# Patient Record
Sex: Female | Born: 1990 | Race: White | Hispanic: No | Marital: Single | Smoking: Never smoker
Health system: Southern US, Community
[De-identification: ages and names within clinical notes are randomized; demographics above are authoritative.]

## PROBLEM LIST (undated history)

## (undated) DIAGNOSIS — E079 Disorder of thyroid, unspecified: Secondary | ICD-10-CM

## (undated) DIAGNOSIS — B019 Varicella without complication: Secondary | ICD-10-CM

## (undated) HISTORY — DX: Varicella without complication: B01.9

## (undated) HISTORY — PX: WISDOM TOOTH EXTRACTION: SHX21

## (undated) HISTORY — DX: Disorder of thyroid, unspecified: E07.9

---

## 2010-06-13 ENCOUNTER — Encounter: Admission: RE | Admit: 2010-06-13 | Discharge: 2010-06-13 | Payer: Self-pay | Admitting: Orthopedic Surgery

## 2013-08-22 ENCOUNTER — Ambulatory Visit (HOSPITAL_COMMUNITY): Payer: Self-pay | Admitting: Psychiatry

## 2013-10-31 ENCOUNTER — Encounter: Payer: Self-pay | Admitting: Nurse Practitioner

## 2013-10-31 ENCOUNTER — Ambulatory Visit (INDEPENDENT_AMBULATORY_CARE_PROVIDER_SITE_OTHER): Payer: 59 | Admitting: Nurse Practitioner

## 2013-10-31 VITALS — BP 100/60 | HR 71 | Temp 97.9°F | Ht 66.17 in | Wt 147.0 lb

## 2013-10-31 DIAGNOSIS — Z0000 Encounter for general adult medical examination without abnormal findings: Secondary | ICD-10-CM

## 2013-10-31 DIAGNOSIS — G8929 Other chronic pain: Secondary | ICD-10-CM | POA: Insufficient documentation

## 2013-10-31 DIAGNOSIS — M549 Dorsalgia, unspecified: Secondary | ICD-10-CM

## 2013-10-31 DIAGNOSIS — Z111 Encounter for screening for respiratory tuberculosis: Secondary | ICD-10-CM

## 2013-10-31 MED ORDER — TUBERCULIN PPD 5 UNIT/0.1ML ID SOLN
5.0000 [IU] | Freq: Once | INTRADERMAL | Status: DC
  Administered 2013-10-31: 5 [IU] via INTRADERMAL

## 2013-10-31 NOTE — Patient Instructions (Addendum)
Our office will call to discuss lab results. Return in 2 days for TB test reading. Second test can be placed next Monday or anytime after. Next physical in 2-5 years. See guidelines for pap screening. Great to meet you! Best wishes with school adventures.  Preventive Care for Adults, Female A healthy lifestyle and preventive care can promote health and wellness. Preventive health guidelines for women include the following key practices.  A routine yearly physical is a good way to check with your caregiver about your health and preventive screening. It is a chance to share any concerns and updates on your health, and to receive a thorough exam.  Visit your dentist for a routine exam and preventive care every 6 months. Brush your teeth twice a day and floss once a day. Good oral hygiene prevents tooth decay and gum disease.  The frequency of eye exams is based on your age, health, family medical history, use of contact lenses, and other factors. Follow your caregiver's recommendations for frequency of eye exams.  Eat a healthy diet. Foods like vegetables, fruits, whole grains, low-fat dairy products, and lean protein foods contain the nutrients you need without too many calories. Decrease your intake of foods high in solid fats, added sugars, and salt. Eat the right amount of calories for you.Get information about a proper diet from your caregiver, if necessary.  Regular physical exercise is one of the most important things you can do for your health. Most adults should get at least 150 minutes of moderate-intensity exercise (any activity that increases your heart rate and causes you to sweat) each week. In addition, most adults need muscle-strengthening exercises on 2 or more days a week.  Maintain a healthy weight. The body mass index (BMI) is a screening tool to identify possible weight problems. It provides an estimate of body fat based on height and weight. Your caregiver can help determine your  BMI, and can help you achieve or maintain a healthy weight.For adults 20 years and older:  A BMI below 18.5 is considered underweight.  A BMI of 18.5 to 24.9 is normal.  A BMI of 25 to 29.9 is considered overweight.  A BMI of 30 and above is considered obese.  Maintain normal blood lipids and cholesterol levels by exercising and minimizing your intake of saturated fat. Eat a balanced diet with plenty of fruit and vegetables. Blood tests for lipids and cholesterol should begin at age 28 and be repeated every 5 years. If your lipid or cholesterol levels are high, you are over 50, or you are at high risk for heart disease, you may need your cholesterol levels checked more frequently.Ongoing high lipid and cholesterol levels should be treated with medicines if diet and exercise are not effective.  If you smoke, find out from your caregiver how to quit. If you do not use tobacco, do not start.  Lung cancer screening is recommended for adults aged 8 80 years who are at high risk for developing lung cancer because of a history of smoking. Yearly low-dose computed tomography (CT) is recommended for people who have at least a 30-pack-year history of smoking and are a current smoker or have quit within the past 15 years. A pack year of smoking is smoking an average of 1 pack of cigarettes a day for 1 year (for example: 1 pack a day for 30 years or 2 packs a day for 15 years). Yearly screening should continue until the smoker has stopped smoking for at least  15 years. Yearly screening should also be stopped for people who develop a health problem that would prevent them from having lung cancer treatment.  If you are pregnant, do not drink alcohol. If you are breastfeeding, be very cautious about drinking alcohol. If you are not pregnant and choose to drink alcohol, do not exceed 1 drink per day. One drink is considered to be 12 ounces (355 mL) of beer, 5 ounces (148 mL) of wine, or 1.5 ounces (44 mL) of  liquor.  Avoid use of street drugs. Do not share needles with anyone. Ask for help if you need support or instructions about stopping the use of drugs.  High blood pressure causes heart disease and increases the risk of stroke. Your blood pressure should be checked at least every 1 to 2 years. Ongoing high blood pressure should be treated with medicines if weight loss and exercise are not effective.  If you are 22 to 22 years old, ask your caregiver if you should take aspirin to prevent strokes.  Diabetes screening involves taking a blood sample to check your fasting blood sugar level. This should be done once every 3 years, after age 34, if you are within normal weight and without risk factors for diabetes. Testing should be considered at a younger age or be carried out more frequently if you are overweight and have at least 1 risk factor for diabetes.  Breast cancer screening is essential preventive care for women. You should practice "breast self-awareness." This means understanding the normal appearance and feel of your breasts and may include breast self-examination. Any changes detected, no matter how small, should be reported to a caregiver. Women in their 24s and 30s should have a clinical breast exam (CBE) by a caregiver as part of a regular health exam every 1 to 3 years. After age 58, women should have a CBE every year. Starting at age 32, women should consider having a mammography (breast X-ray test) every year. Women who have a family history of breast cancer should talk to their caregiver about genetic screening. Women at a high risk of breast cancer should talk to their caregivers about having magnetic resonance imaging (MRI) and a mammography every year.  Breast cancer gene (BRCA)-related cancer risk assessment is recommended for women who have family members with BRCA-related cancers. BRCA-related cancers include breast, ovarian, tubal, and peritoneal cancers. Having family members with  these cancers may be associated with an increased risk for harmful changes (mutations) in the breast cancer genes BRCA1 and BRCA2. Results of the assessment will determine the need for genetic counseling and BRCA1 and BRCA2 testing.  The Pap test is a screening test for cervical cancer. A Pap test can show cell changes on the cervix that might become cervical cancer if left untreated. A Pap test is a procedure in which cells are obtained and examined from the lower end of the uterus (cervix).  Women should have a Pap test starting at age 3.  Between ages 68 and 31, Pap tests should be repeated every 2 years.  Beginning at age 36, you should have a Pap test every 3 years as long as the past 3 Pap tests have been normal.  Some women have medical problems that increase the chance of getting cervical cancer. Talk to your caregiver about these problems. It is especially important to talk to your caregiver if a new problem develops soon after your last Pap test. In these cases, your caregiver may recommend more frequent screening  and Pap tests.  The above recommendations are the same for women who have or have not gotten the vaccine for human papillomavirus (HPV).  If you had a hysterectomy for a problem that was not cancer or a condition that could lead to cancer, then you no longer need Pap tests. Even if you no longer need a Pap test, a regular exam is a good idea to make sure no other problems are starting.  If you are between ages 13 and 19, and you have had normal Pap tests going back 10 years, you no longer need Pap tests. Even if you no longer need a Pap test, a regular exam is a good idea to make sure no other problems are starting.  If you have had past treatment for cervical cancer or a condition that could lead to cancer, you need Pap tests and screening for cancer for at least 20 years after your treatment.  If Pap tests have been discontinued, risk factors (such as a new sexual partner)  need to be reassessed to determine if screening should be resumed.  The HPV test is an additional test that may be used for cervical cancer screening. The HPV test looks for the virus that can cause the cell changes on the cervix. The cells collected during the Pap test can be tested for HPV. The HPV test could be used to screen women aged 40 years and older, and should be used in women of any age who have unclear Pap test results. After the age of 60, women should have HPV testing at the same frequency as a Pap test.  Colorectal cancer can be detected and often prevented. Most routine colorectal cancer screening begins at the age of 107 and continues through age 65. However, your caregiver may recommend screening at an earlier age if you have risk factors for colon cancer. On a yearly basis, your caregiver may provide home test kits to check for hidden blood in the stool. Use of a small camera at the end of a tube, to directly examine the colon (sigmoidoscopy or colonoscopy), can detect the earliest forms of colorectal cancer. Talk to your caregiver about this at age 79, when routine screening begins. Direct examination of the colon should be repeated every 5 to 10 years through age 16, unless early forms of pre-cancerous polyps or small growths are found.  Hepatitis C blood testing is recommended for all people born from [birth year] through 1965 and any individual with known risks for hepatitis C.  Practice safe sex. Use condoms and avoid high-risk sexual practices to reduce the spread of sexually transmitted infections (STIs). STIs include gonorrhea, chlamydia, syphilis, trichomonas, herpes, HPV, and human immunodeficiency virus (HIV). Herpes, HIV, and HPV are viral illnesses that have no cure. They can result in disability, cancer, and death. Sexually active women aged 86 and younger should be checked for chlamydia. Older women with new or multiple partners should also be tested for chlamydia. Testing for other  STIs is recommended if you are sexually active and at increased risk.  Osteoporosis is a disease in which the bones lose minerals and strength with aging. This can result in serious bone fractures. The risk of osteoporosis can be identified using a bone density scan. Women ages 6 and over and women at risk for fractures or osteoporosis should discuss screening with their caregivers. Ask your caregiver whether you should take a calcium supplement or vitamin D to reduce the rate of osteoporosis.  Menopause can be  associated with physical symptoms and risks. Hormone replacement therapy is available to decrease symptoms and risks. You should talk to your caregiver about whether hormone replacement therapy is right for you.  Use sunscreen. Apply sunscreen liberally and repeatedly throughout the day. You should seek shade when your shadow is shorter than you. Protect yourself by wearing long sleeves, pants, a wide-brimmed hat, and sunglasses year round, whenever you are outdoors.  Once a month, do a whole body skin exam, using a mirror to look at the skin on your back. Notify your caregiver of new moles, moles that have irregular borders, moles that are larger than a pencil eraser, or moles that have changed in shape or color.  Stay current with required immunizations.  Influenza vaccine. All adults should be immunized every year.  Tetanus, diphtheria, and acellular pertussis (Td, Tdap) vaccine. Pregnant women should receive 1 dose of Tdap vaccine during each pregnancy. The dose should be obtained regardless of the length of time since the last dose. Immunization is preferred during the 27th to 36th week of gestation. An adult who has not previously received Tdap or who does not know her vaccine status should receive 1 dose of Tdap. This initial dose should be followed by tetanus and diphtheria toxoids (Td) booster doses every 10 years. Adults with an unknown or incomplete history of completing a 3-dose  immunization series with Td-containing vaccines should begin or complete a primary immunization series including a Tdap dose. Adults should receive a Td booster every 10 years.  Varicella vaccine. An adult without evidence of immunity to varicella should receive 2 doses or a second dose if she has previously received 1 dose. Pregnant females who do not have evidence of immunity should receive the first dose after pregnancy. This first dose should be obtained before leaving the health care facility. The second dose should be obtained 4 8 weeks after the first dose.  Human papillomavirus (HPV) vaccine. Females aged 55 26 years who have not received the vaccine previously should obtain the 3-dose series. The vaccine is not recommended for use in pregnant females. However, pregnancy testing is not needed before receiving a dose. If a female is found to be pregnant after receiving a dose, no treatment is needed. In that case, the remaining doses should be delayed until after the pregnancy. Immunization is recommended for any person with an immunocompromised condition through the age of 26 years if she did not get any or all doses earlier. During the 3-dose series, the second dose should be obtained 4 8 weeks after the first dose. The third dose should be obtained 24 weeks after the first dose and 16 weeks after the second dose.  Zoster vaccine. One dose is recommended for adults aged 91 years or older unless certain conditions are present.  Measles, mumps, and rubella (MMR) vaccine. Adults born before [birth year] generally are considered immune to measles and mumps. Adults born in [birth year] or later should have 1 or more doses of MMR vaccine unless there is a contraindication to the vaccine or there is laboratory evidence of immunity to each of the three diseases. A routine second dose of MMR vaccine should be obtained at least 28 days after the first dose for students attending postsecondary schools, health care workers, or  international travelers. People who received inactivated measles vaccine or an unknown type of measles vaccine during 1963 1967 should receive 2 doses of MMR vaccine. People who received inactivated mumps vaccine or an unknown type of mumps vaccine  before 1979 and are at high risk for mumps infection should consider immunization with 2 doses of MMR vaccine. For females of childbearing age, rubella immunity should be determined. If there is no evidence of immunity, females who are not pregnant should be vaccinated. If there is no evidence of immunity, females who are pregnant should delay immunization until after pregnancy. Unvaccinated health care workers born before [birth year] who lack laboratory evidence of measles, mumps, or rubella immunity or laboratory confirmation of disease should consider measles and mumps immunization with 2 doses of MMR vaccine or rubella immunization with 1 dose of MMR vaccine.  Pneumococcal 13-valent conjugate (PCV13) vaccine. When indicated, a person who is uncertain of her immunization history and has no record of immunization should receive the PCV13 vaccine. An adult aged 86 years or older who has certain medical conditions and has not been previously immunized should receive 1 dose of PCV13 vaccine. This PCV13 should be followed with a dose of pneumococcal polysaccharide (PPSV23) vaccine. The PPSV23 vaccine dose should be obtained at least 8 weeks after the dose of PCV13 vaccine. An adult aged 33 years or older who has certain medical conditions and previously received 1 or more doses of PPSV23 vaccine should receive 1 dose of PCV13. The PCV13 vaccine dose should be obtained 1 or more years after the last PPSV23 vaccine dose.  Pneumococcal polysaccharide (PPSV23) vaccine. When PCV13 is also indicated, PCV13 should be obtained first. All adults aged 28 years and older should be immunized. An adult younger than age 8 years who has certain medical conditions should be immunized. Any  person who resides in a nursing home or long-term care facility should be immunized. An adult smoker should be immunized. People with an immunocompromised condition and certain other conditions should receive both PCV13 and PPSV23 vaccines. People with human immunodeficiency virus (HIV) infection should be immunized as soon as possible after diagnosis. Immunization during chemotherapy or radiation therapy should be avoided. Routine use of PPSV23 vaccine is not recommended for American Indians, [address], or people younger than 65 years unless there are medical conditions that require PPSV23 vaccine. When indicated, people who have unknown immunization and have no record of immunization should receive PPSV23 vaccine. One-time revaccination 5 years after the first dose of PPSV23 is recommended for people aged 51 64 years who have chronic kidney failure, nephrotic syndrome, asplenia, or immunocompromised conditions. People who received 1 2 doses of PPSV23 before age 78 years should receive another dose of PPSV23 vaccine at age 62 years or later if at least 5 years have passed since the previous dose. Doses of PPSV23 are not needed for people immunized with PPSV23 at or after age 19 years.  Meningococcal vaccine. Adults with asplenia or persistent complement component deficiencies should receive 2 doses of quadrivalent meningococcal conjugate (MenACWY-D) vaccine. The doses should be obtained at least 2 months apart. Microbiologists working with certain meningococcal bacteria, military recruits, people at risk during an outbreak, and people who travel to or live in countries with a high rate of meningitis should be immunized. A first-year college student up through age 44 years who is living in a residence hall should receive a dose if she did not receive a dose on or after her 16th birthday. Adults who have certain high-risk conditions should receive one or more doses of vaccine.  Hepatitis A vaccine. Adults  who wish to be protected from this disease, have certain high-risk conditions, work with hepatitis A-infected animals, work in hepatitis A research  labs, or travel to or work in countries with a high rate of hepatitis A should be immunized. Adults who were previously unvaccinated and who anticipate close contact with an international adoptee during the first 60 days after arrival in the [place] from a country with a high rate of hepatitis A should be immunized.  Hepatitis B vaccine. Adults who wish to be protected from this disease, have certain high-risk conditions, may be exposed to blood or other infectious body fluids, are household contacts or sex partners of hepatitis B positive people, are clients or workers in certain care facilities, or travel to or work in countries with a high rate of hepatitis B should be immunized.  Haemophilus influenzae type b (Hib) vaccine. A previously unvaccinated person with asplenia or sickle cell disease or having a scheduled splenectomy should receive 1 dose of Hib vaccine. Regardless of previous immunization, a recipient of a hematopoietic stem cell transplant should receive a 3-dose series 6 12 months after her successful transplant. Hib vaccine is not recommended for adults with HIV infection. Preventive Services / Frequency Ages 77 to 9  Blood pressure check.** / Every 1 to 2 years.  Lipid and cholesterol check.** / Every 5 years beginning at age 66.  Clinical breast exam.** / Every 3 years for women in their 94s and 30s.  BRCA-related cancer risk assessment.** / For women who have family members with a BRCA-related cancer (breast, ovarian, tubal, or peritoneal cancers).  Pap test.** / Every 2 years from ages 59 through 37. Every 3 years starting at age 42 through age 80 or 34 with a history of 3 consecutive normal Pap tests.  HPV screening.** / Every 3 years from ages 76 through ages 35 to 23 with a history of 3 consecutive normal Pap  tests.  Hepatitis C blood test.** / For any individual with known risks for hepatitis C.  Skin self-exam. / Monthly.  Influenza vaccine. / Every year.  Tetanus, diphtheria, and acellular pertussis (Tdap, Td) vaccine.** / Consult your caregiver. Pregnant women should receive 1 dose of Tdap vaccine during each pregnancy. 1 dose of Td every 10 years.  Varicella vaccine.** / Consult your caregiver. Pregnant females who do not have evidence of immunity should receive the first dose after pregnancy.  HPV vaccine. / 3 doses over 6 months, if 26 and younger. The vaccine is not recommended for use in pregnant females. However, pregnancy testing is not needed before receiving a dose.  Measles, mumps, rubella (MMR) vaccine.** / You need at least 1 dose of MMR if you were born in 1957 or later. You may also need a 2nd dose. For females of childbearing age, rubella immunity should be determined. If there is no evidence of immunity, females who are not pregnant should be vaccinated. If there is no evidence of immunity, females who are pregnant should delay immunization until after pregnancy.  Pneumococcal 13-valent conjugate (PCV13) vaccine.** / Consult your caregiver.  Pneumococcal polysaccharide (PPSV23) vaccine.** / 1 to 2 doses if you smoke cigarettes or if you have certain conditions.  Meningococcal vaccine.** / 1 dose if you are age 57 to 64 years and a Orthoptist living in a residence hall, or have one of several medical conditions, you need to get vaccinated against meningococcal disease. You may also need additional booster doses.  Hepatitis A vaccine.** / Consult your caregiver.  Hepatitis B vaccine.** / Consult your caregiver.  Haemophilus influenzae type b (Hib) vaccine.** / Consult your caregiver. ** Family history  and personal history of risk and conditions may change your caregiver's recommendations. Document Released: 12/16/2001 Document Revised: 02/14/2013 Document  Reviewed: 03/17/2011 [facility] Patient Information 2014 [place], [place].

## 2013-10-31 NOTE — Progress Notes (Signed)
Pre-visit discussion using our clinic review tool. No additional management support is needed unless otherwise documented below in the visit note.  

## 2013-10-31 NOTE — Progress Notes (Signed)
Subjective:     Patricia [name] is a 22 y.o. female and is here for a comprehensive physical exam. Historically she was a patient at [facility]. She sees gynecology for well woman exams & contraceptive management. She brings school forms for review. After reviewing, I have determined she needs physical, Two step TB test, and varicella titer. The patient reports problems - chronic back pain for which she sees chiropractor with relief..  History   Social History  . Marital Status: Single    Spouse Name: N/A    Number of Children: N/A  . Years of Education: N/A   Occupational History  . Not on file.   Social History Main Topics  . Smoking status: Never Smoker   . Smokeless tobacco: Never Used  . Alcohol Use: Yes  . Drug Use: No  . Sexual Activity: Not on file   Other Topics Concern  . Not on file   Social History Narrative  . No narrative on file   Health Maintenance  Topic Date Due  . Influenza Vaccine  06/03/2014  . Pap Smear  05/01/2016  . Tetanus/tdap  10/31/2020    The following portions of the patient's history were reviewed and updated as appropriate: allergies, current medications, past family history, past medical history, past social history, past surgical history and problem list.  Review of Systems Constitutional: negative Eyes: positive for contacts/glasses Ears, nose, mouth, throat, and face: negative Respiratory: negative Cardiovascular: negative Gastrointestinal: negative Genitourinary:negative Integument/breast: negative Musculoskeletal:positive for back pain worse in am & after prolonged sitting, Chronic L knee pain with working out & prolonged standing. Denies injury. Behavioral/Psych: negative, had referral to [facility] from [place] for ADD work-up, pt did not go. Allergic/Immunologic: negative   Objective:    BP 100/60  Pulse 71  Temp(Src) 97.9 F (36.6 C) (Oral)  Ht 5' 6.17" (1.681 m)  Wt 147 lb (66.679 kg)  BMI 23.60 kg/m2  SpO2 98%  LMP  08/31/2013 General appearance: alert, cooperative, appears stated age and no distress Head: Normocephalic, without obvious abnormality, atraumatic Eyes: negative findings: lids and lashes normal, conjunctivae and sclerae normal, corneas clear and pupils equal, round, reactive to light and accomodation Ears: normal TM's and external ear canals both ears Throat: lips, mucosa, and tongue normal; teeth and gums normal Neck: no adenopathy, no carotid bruit, supple, symmetrical, trachea midline and thyroid not enlarged, symmetric, no tenderness/mass/nodules Lungs: clear to auscultation bilaterally Heart: regular rate and rhythm, S1, S2 normal, no murmur, click, rub or gallop Abdomen: soft, non-tender; bowel sounds normal; no masses,  no organomegaly Extremities: extremities normal, atraumatic, no cyanosis or edema Pulses: 2+ and symmetric Skin: Skin color, texture, turgor normal. No rashes or lesions or palpable crepitus lateral L knee Lymph nodes: Cervical, supraclavicular, and axillary nodes normal. Neurologic: Alert and oriented X 3, normal strength and tone. Normal symmetric reflexes. Normal coordination and gait    Assessment:    Healthy female exam Prev care- cont to f/u gyn for well woman yearly X 2 or may sched at this ofc., screening labs, tb test today, varicella titer per school admission requirement.     Plan:    Next CPE 2 yrs, well woman 6/15. Monitor labs, Return in 2 days for PPD reading. Second step may be placed in 1 week. May bring physical form from school when returns for PPD read. See After Visit Summary for Counseling Recommendations

## 2013-11-02 ENCOUNTER — Ambulatory Visit: Payer: 59 | Admitting: *Deleted

## 2013-11-02 DIAGNOSIS — IMO0001 Reserved for inherently not codable concepts without codable children: Secondary | ICD-10-CM

## 2013-11-07 ENCOUNTER — Ambulatory Visit (INDEPENDENT_AMBULATORY_CARE_PROVIDER_SITE_OTHER): Payer: 59 | Admitting: *Deleted

## 2013-11-07 DIAGNOSIS — IMO0001 Reserved for inherently not codable concepts without codable children: Secondary | ICD-10-CM

## 2013-11-07 DIAGNOSIS — A184 Tuberculosis of skin and subcutaneous tissue: Secondary | ICD-10-CM

## 2013-11-09 LAB — TB SKIN TEST
Induration: 7 mm
TB SKIN TEST: NEGATIVE

## 2013-11-18 ENCOUNTER — Encounter: Payer: Self-pay | Admitting: *Deleted

## 2014-02-01 ENCOUNTER — Other Ambulatory Visit: Payer: Self-pay

## 2014-02-01 DIAGNOSIS — Z0000 Encounter for general adult medical examination without abnormal findings: Secondary | ICD-10-CM

## 2014-02-02 ENCOUNTER — Other Ambulatory Visit: Payer: 59

## 2014-02-02 DIAGNOSIS — Z0000 Encounter for general adult medical examination without abnormal findings: Secondary | ICD-10-CM

## 2014-02-02 LAB — CBC WITH DIFFERENTIAL/PLATELET
Basophils Absolute: 0.1 10*3/uL (ref 0.0–0.1)
Basophils Relative: 1 % (ref 0–1)
Eosinophils Absolute: 0.3 10*3/uL (ref 0.0–0.7)
Eosinophils Relative: 5 % (ref 0–5)
HCT: 38.2 % (ref 36.0–46.0)
HEMOGLOBIN: 13.2 g/dL (ref 12.0–15.0)
LYMPHS ABS: 2.1 10*3/uL (ref 0.7–4.0)
Lymphocytes Relative: 36 % (ref 12–46)
MCH: 29.3 pg (ref 26.0–34.0)
MCHC: 34.6 g/dL (ref 30.0–36.0)
MCV: 84.9 fL (ref 78.0–100.0)
MONO ABS: 0.5 10*3/uL (ref 0.1–1.0)
MONOS PCT: 8 % (ref 3–12)
NEUTROS ABS: 2.9 10*3/uL (ref 1.7–7.7)
NEUTROS PCT: 50 % (ref 43–77)
PLATELETS: 253 10*3/uL (ref 150–400)
RBC: 4.5 MIL/uL (ref 3.87–5.11)
RDW: 13.4 % (ref 11.5–15.5)
WBC: 5.8 10*3/uL (ref 4.0–10.5)

## 2014-02-02 LAB — COMPREHENSIVE METABOLIC PANEL
ALBUMIN: 4.1 g/dL (ref 3.5–5.2)
ALK PHOS: 48 U/L (ref 39–117)
ALT: <8 U/L (ref 0–35)
AST: 14 U/L (ref 0–37)
BUN: 11 mg/dL (ref 6–23)
CALCIUM: 9.5 mg/dL (ref 8.4–10.5)
CO2: 26 mEq/L (ref 19–32)
Chloride: 104 mEq/L (ref 96–112)
Creat: 0.66 mg/dL (ref 0.50–1.10)
Glucose, Bld: 82 mg/dL (ref 70–99)
POTASSIUM: 3.9 meq/L (ref 3.5–5.3)
Sodium: 138 mEq/L (ref 135–145)
Total Bilirubin: 0.5 mg/dL (ref 0.2–1.2)
Total Protein: 7 g/dL (ref 6.0–8.3)

## 2014-02-02 LAB — LIPID PANEL
CHOLESTEROL: 187 mg/dL (ref 0–200)
HDL: 65 mg/dL (ref >39)
LDL Cholesterol: 95 mg/dL (ref 0–99)
TRIGLYCERIDES: 134 mg/dL (ref <150)
Total CHOL/HDL Ratio: 2.9 Ratio
VLDL: 27 mg/dL (ref 0–40)

## 2014-02-02 LAB — TSH: TSH: 8.415 u[IU]/mL — AB (ref 0.350–4.500)

## 2014-02-02 LAB — T4, FREE: FREE T4: 1.16 ng/dL (ref 0.80–1.80)

## 2014-02-03 LAB — VITAMIN D 25 HYDROXY (VIT D DEFICIENCY, FRACTURES): Vit D, 25-Hydroxy: 66 ng/mL (ref 30–89)

## 2014-02-03 LAB — HIV ANTIBODY (ROUTINE TESTING W REFLEX): HIV: NONREACTIVE

## 2014-02-03 LAB — VARICELLA ZOSTER ANTIBODY, IGG: VARICELLA IGG: 1675 {index} — AB (ref <135.00)

## 2014-02-06 ENCOUNTER — Other Ambulatory Visit: Payer: Self-pay | Admitting: Nurse Practitioner

## 2014-02-06 ENCOUNTER — Encounter: Payer: Self-pay | Admitting: *Deleted

## 2014-02-06 DIAGNOSIS — R7989 Other specified abnormal findings of blood chemistry: Secondary | ICD-10-CM

## 2014-03-14 ENCOUNTER — Encounter: Payer: Self-pay | Admitting: Nurse Practitioner

## 2014-03-14 ENCOUNTER — Ambulatory Visit: Payer: 59 | Admitting: Nurse Practitioner

## 2014-03-14 ENCOUNTER — Ambulatory Visit (INDEPENDENT_AMBULATORY_CARE_PROVIDER_SITE_OTHER): Payer: 59 | Admitting: Nurse Practitioner

## 2014-03-14 VITALS — BP 100/70 | HR 71 | Temp 98.5°F | Ht 66.17 in | Wt 148.5 lb

## 2014-03-14 DIAGNOSIS — R7989 Other specified abnormal findings of blood chemistry: Secondary | ICD-10-CM

## 2014-03-14 DIAGNOSIS — R946 Abnormal results of thyroid function studies: Secondary | ICD-10-CM

## 2014-03-14 LAB — TSH: TSH: 6.25 u[IU]/mL — ABNORMAL HIGH (ref 0.35–4.50)

## 2014-03-14 LAB — T4, FREE: Free T4: 0.93 ng/dL (ref 0.60–1.60)

## 2014-03-14 NOTE — Patient Instructions (Signed)
My office will call with results and any necessary follow up. Nice to see you!  Thyroid-Stimulating Hormone The amount of thyroid-stimulating hormone (TSH) or thyrotropin can be measured from a sample of blood. The TSH level can help diagnose thyroid gland or pituitary gland disorders, or monitor treatment of hypothyroidism and hyperthyroidism. TSH is produced by the pituitary gland, a tiny organ located below the brain. The pituitary gland is part of the body's feedback system to maintain stable levels of thyroid hormones released into the blood. Thyroid hormones help control the rate at which the body uses energy. The pituitary gland monitors the level of thyroid hormones released by the thyroid gland. The thyroid gland is a small butterfly-shaped gland that lies flat against the windpipe. If the thyroid gland does not release enough thyroid hormones, the pituitary gland detects the reduced thyroid hormone levels. The pituitary gland then makes more TSH to trigger the thyroid gland to produce more thyroid hormones. This increase in TSH is an effort to return the low thyroid hormones to normal levels. The increased TSH level is caused by the low thyroid hormone levels of an underactive thyroid (hypothyroidism). Symptoms of hypothyroidism include menstrual irregularities in women, weight gain, dry skin, constipation, cold intolerance, and fatigue. Rarely, a high TSH level can indicate a problem with the pituitary gland. A high TSH level could also occur when there is an insufficient level of thyroid hormone medication in individuals receiving that medication. If the thyroid gland releases too much thyroid hormones, the pituitary gland detects the increased thyroid hormone levels. The pituitary gland then makes less TSH to slow the thyroid gland from producing thyroid hormones. This decrease in TSH is an effort to return the increased thyroid hormones to normal levels. The decreased TSH level is caused by the  excess thyroid hormone levels of an overactive thyroid gland (hyperthyroidism). Symptoms associated with hyperthyroidism include rapid heart rate, weight loss, nervousness, hand tremors, irritated eyes, and difficulty sleeping. Rarely, a low TSH level can indicate a problem with the pituitary gland. PREPARATION FOR TEST No specific preparation is required for this blood test. A blood sample is obtained from a needle placed in a vein in your arm or from pricking the heel of an infant.  NORMAL FINDINGS  Adult: 0.5-5 milli-international Units/L (0.5-5 mIU/L)  Newborn: 3-20 milli-international Units/L (3-20 mIU/L)  Cord: 3-12 milli-international Units/L (3-12 mIU/L) Ranges for normal findings may vary among different laboratories and hospitals. You should always check with your doctor after having lab work or other tests done to discuss the meaning of your test results and whether your values are considered within normal limits. MEANING OF TEST  Your caregiver will go over the test results with you and discuss the importance and meaning of your results, as well as treatment options and the need for additional tests if necessary. OBTAINING THE TEST RESULTS It is your responsibility to obtain your test results. Ask the lab or department performing the test when and how you will get your results. Document Released: 11/14/2004 Document Revised: 01/12/2012 Document Reviewed: 10/01/2008 [facility] Patient Information 2014 [place], [place].

## 2014-03-14 NOTE — Progress Notes (Signed)
Pre visit review using our clinic review tool, if applicable. No additional management support is needed unless otherwise documented below in the visit note. 

## 2014-03-14 NOTE — Progress Notes (Signed)
Subjective:     Patricia [name] is a 23 y.o. female who presents for follow up of elevated TSH with normal free T4. Current symptoms: none. Patient denies change in energy level, diarrhea, heat / cold intolerance, nervousness, palpitations and weight changes. MC is regulated by OBCP prescribed by GYN.  The following portions of the patient's history were reviewed and updated as appropriate: allergies, current medications, past family history, past medical history, past social history, past surgical history and problem list.  Review of Systems Pertinent items are noted in HPI.    Objective:    BP 100/70  Pulse 71  Temp(Src) 98.5 F (36.9 C) (Oral)  Ht 5' 6.17" (1.681 m)  Wt 148 lb 8 oz (67.359 kg)  BMI 23.84 kg/m2  SpO2 97% General appearance: alert, cooperative, appears stated age and no distress Head: Normocephalic, without obvious abnormality, atraumatic Eyes: negative findings: lids and lashes normal and conjunctivae and sclerae normal Lungs: clear to auscultation bilaterally Heart: regular rate and rhythm, S1, S2 normal, no murmur, click, rub or gallop  Laboratory: Lab Results  Component Value Date   TSH 8.415* 02/02/2014      Assessment:   Elevated TSH  Plan:    1.TSH, Free T4, TPO ab F/u PRN per labs

## 2014-03-15 LAB — THYROID PEROXIDASE ANTIBODY: Thyroperoxidase Ab SerPl-aCnc: 47.1 IU/mL — ABNORMAL HIGH (ref <35.0)

## 2014-03-16 ENCOUNTER — Telehealth: Payer: Self-pay | Admitting: Nurse Practitioner

## 2014-03-16 DIAGNOSIS — E063 Autoimmune thyroiditis: Principal | ICD-10-CM

## 2014-03-16 DIAGNOSIS — E038 Other specified hypothyroidism: Secondary | ICD-10-CM

## 2014-03-16 MED ORDER — LEVOTHYROXINE SODIUM 25 MCG PO TABS: 12.5000 ug | ORAL_TABLET | Freq: Every day | ORAL | Status: DC

## 2014-03-16 NOTE — Telephone Encounter (Signed)
See ph note

## 2014-03-16 NOTE — Telephone Encounter (Signed)
  Labs reveal Hashimoto hypothyroidism. Will start 12.5 mcg levothyroxine qd. Yearly [place] surveillance for thyroid nodules given increased risk for thyroid ca. OV in 7 weeks.

## 2014-03-16 NOTE — Telephone Encounter (Signed)
Scheduled appt.

## 2014-03-17 NOTE — Telephone Encounter (Signed)
I called the [facility] in [facility], they are going to contact the patient to schedule her. If the patient opts to go to [facility] they can help her with scheduling there.

## 2014-03-21 ENCOUNTER — Ambulatory Visit (INDEPENDENT_AMBULATORY_CARE_PROVIDER_SITE_OTHER): Payer: 59

## 2014-03-21 DIAGNOSIS — E038 Other specified hypothyroidism: Secondary | ICD-10-CM

## 2014-03-21 DIAGNOSIS — E0510 Thyrotoxicosis with toxic single thyroid nodule without thyrotoxic crisis or storm: Secondary | ICD-10-CM

## 2014-03-21 DIAGNOSIS — E063 Autoimmune thyroiditis: Principal | ICD-10-CM

## 2014-03-21 NOTE — Telephone Encounter (Signed)
pls call pt: Advise Thyroid appearance is consistent w/hashimoto thyroid disease. There is 1 small nodule on the right lobe. We will image it again in 1 year. It is not bid enough to need biopsy.

## 2014-03-22 NOTE — Telephone Encounter (Signed)
Patient notified of results.

## 2014-04-18 ENCOUNTER — Ambulatory Visit: Payer: Self-pay | Admitting: Nurse Practitioner

## 2014-05-01 ENCOUNTER — Encounter: Payer: Self-pay | Admitting: Nurse Practitioner

## 2014-05-01 ENCOUNTER — Ambulatory Visit (INDEPENDENT_AMBULATORY_CARE_PROVIDER_SITE_OTHER): Payer: 59 | Admitting: Nurse Practitioner

## 2014-05-01 VITALS — BP 116/79 | HR 74 | Temp 98.1°F | Resp 18 | Ht 66.0 in | Wt 145.0 lb

## 2014-05-01 DIAGNOSIS — E038 Other specified hypothyroidism: Secondary | ICD-10-CM | POA: Insufficient documentation

## 2014-05-01 DIAGNOSIS — E063 Autoimmune thyroiditis: Secondary | ICD-10-CM

## 2014-05-01 LAB — TSH: TSH: 4.15 u[IU]/mL (ref 0.35–4.50)

## 2014-05-01 MED ORDER — LEVOTHYROXINE SODIUM 25 MCG PO TABS: 12.5000 ug | ORAL_TABLET | Freq: Every day | ORAL | Status: DC

## 2014-05-01 NOTE — Progress Notes (Signed)
Pre visit review using our clinic review tool, if applicable. No additional management support is needed unless otherwise documented below in the visit note. 

## 2014-05-01 NOTE — Patient Instructions (Signed)
My office will call with lab results & follow up. When getting thyroid replacement therapy, watch for signs of hyperthyroidism & let us know if you experience any of these.   Nervousness  Heat intolerance  Weight loss (in spite of increase food intake)  Diarrhea  Change in hair or skin texture  Palpitations (heart skipping or having extra beats)  Tachycardia (rapid heart rate)  Loss of menstruation (amenorrhea)  Shaking of the hands

## 2014-05-01 NOTE — Progress Notes (Signed)
Subjective:     Patricia [name] is a 23 y.o. female who presents for follow up of hypothyroidism. Current symptoms: none. Patient denies change in energy level, diarrhea, heat / cold intolerance, nervousness and palpitations. She has, however lost 3.5 pounds since last visit 7 weeks ago.   The following portions of the patient's history were reviewed and updated as appropriate: allergies, current medications, past family history, past medical history, past social history, past surgical history and problem list.  Review of Systems Pertinent items are noted in HPI.    Objective:    BP 116/79  Pulse 74  Temp(Src) 98.1 F (36.7 C) (Oral)  Resp 18  Ht 5\' 6"  (1.676 m)  Wt 145 lb (65.772 kg)  BMI 23.41 kg/m2  SpO2 100%  LMP 04/24/2014 General appearance: alert, cooperative, appears stated age and no distress Head: Normocephalic, without obvious abnormality, atraumatic Eyes: negative findings: lids and lashes normal, conjunctivae and sclerae normal and corneas clear Neck: supple, symmetrical, trachea midline and thyroid: enlarged Lungs: clear to auscultation bilaterally Heart: regular rate and rhythm, S1, S2 normal, no murmur, click, rub or gallop  Laboratory: Lab Results  Component Value Date   TSH 6.25* 03/14/2014      Assessment:    Hashimoto Hypothyroidism.  Replacement 12.5 mg synthroid qd.     Plan:    1. L-thyroxine per orders. 2. Recheck thyroid function tests in 3 months. 3. Follow up in 3 months.

## 2014-05-02 ENCOUNTER — Telehealth: Payer: Self-pay | Admitting: Nurse Practitioner

## 2014-05-02 DIAGNOSIS — E038 Other specified hypothyroidism: Secondary | ICD-10-CM

## 2014-05-02 DIAGNOSIS — E063 Autoimmune thyroiditis: Principal | ICD-10-CM

## 2014-05-02 MED ORDER — LEVOTHYROXINE SODIUM 25 MCG PO TABS: ORAL_TABLET | ORAL | Status: DC

## 2014-05-02 NOTE — Telephone Encounter (Signed)
pls call pt: Advise new script for levothyroxine sent. Needs more medicine. She will take 1 whole tab qod & 1/2 tab on other days. Please schedule OV in 6 weeks.

## 2014-05-03 NOTE — Telephone Encounter (Signed)
Spoke with pt, advised message from [place]. Pt understood. 

## 2014-05-23 ENCOUNTER — Other Ambulatory Visit: Payer: Self-pay | Admitting: *Deleted

## 2014-05-23 DIAGNOSIS — E038 Other specified hypothyroidism: Secondary | ICD-10-CM

## 2014-05-23 DIAGNOSIS — E063 Autoimmune thyroiditis: Principal | ICD-10-CM

## 2014-05-23 MED ORDER — LEVOTHYROXINE SODIUM 25 MCG PO TABS: ORAL_TABLET | ORAL | Status: DC

## 2014-08-01 ENCOUNTER — Ambulatory Visit: Payer: 59 | Admitting: Nurse Practitioner

## 2014-08-14 ENCOUNTER — Ambulatory Visit (INDEPENDENT_AMBULATORY_CARE_PROVIDER_SITE_OTHER): Payer: 59 | Admitting: Nurse Practitioner

## 2014-08-14 ENCOUNTER — Encounter: Payer: Self-pay | Admitting: Nurse Practitioner

## 2014-08-14 VITALS — BP 120/80 | HR 77 | Temp 98.0°F | Ht 66.0 in | Wt 145.0 lb

## 2014-08-14 DIAGNOSIS — E038 Other specified hypothyroidism: Secondary | ICD-10-CM

## 2014-08-14 DIAGNOSIS — E063 Autoimmune thyroiditis: Secondary | ICD-10-CM

## 2014-08-14 DIAGNOSIS — Z23 Encounter for immunization: Secondary | ICD-10-CM

## 2014-08-14 LAB — TSH: TSH: 3.94 u[IU]/mL (ref 0.35–4.50)

## 2014-08-14 NOTE — Patient Instructions (Addendum)
Continue to take thyroid medicine. My office will call if medication needs to be adjusted.  Remember to take thyroid medicine 1 hour before coffee or breakfast.  See you in 3 months.

## 2014-08-14 NOTE — Progress Notes (Signed)
Pre visit review using our clinic review tool, if applicable. No additional management support is needed unless otherwise documented below in the visit note. 

## 2014-08-14 NOTE — Progress Notes (Signed)
Subjective:     Patricia [name] is a 23 y.o. female who presents for follow up of hypothyroidism. Current symptoms: none. Patient denies change in energy level, diarrhea, nervousness, palpitations and weight changes. Symptoms have stabilized. She is taking full load at school this semester- plans to graduate May, 2016.  The following portions of the patient's history were reviewed and updated as appropriate: allergies, current medications, past family history, past medical history, past social history, past surgical history and problem list.  Review of Systems Pertinent items are noted in HPI.    Objective:    BP 120/80  Pulse 77  Temp(Src) 98 F (36.7 C) (Oral)  Ht 5\' 6"  (1.676 m)  Wt 145 lb (65.772 kg)  BMI 23.41 kg/m2  SpO2 98% General appearance: alert, cooperative, appears stated age and no distress Head: Normocephalic, without obvious abnormality, atraumatic Eyes: negative findings: lids and lashes normal and conjunctivae and sclerae normal Lungs: clear to auscultation bilaterally Heart: regular rate and rhythm, S1, S2 normal, no murmur, click, rub or gallop Skin: Skin color, texture, turgor normal. No rashes or lesions Lymph nodes: thyroid midline, no enlargement, no cervical or [place] LAD  Laboratory: Lab Results  Component Value Date   TSH 4.15 05/01/2014      Assessment:     1. Hypothyroidism due to Hashimoto's thyroiditis - TSH Continue 25 mcg levothyroxine.  2. Need for prophylactic vaccination and inoculation against influenza - Flu Vaccine QUAD 36+ mos IM  F/u 3 mos.

## 2014-08-15 ENCOUNTER — Telehealth: Payer: Self-pay | Admitting: Nurse Practitioner

## 2014-08-15 DIAGNOSIS — E063 Autoimmune thyroiditis: Principal | ICD-10-CM

## 2014-08-15 DIAGNOSIS — E038 Other specified hypothyroidism: Secondary | ICD-10-CM

## 2014-08-15 MED ORDER — LEVOTHYROXINE SODIUM 25 MCG PO TABS: 25.0000 ug | ORAL_TABLET | Freq: Every day | ORAL | Status: DC

## 2014-08-15 NOTE — Telephone Encounter (Signed)
Patient notified of results. Patient scheduled lab appt for 09/27/14 at 9:00 am.

## 2014-08-15 NOTE — Telephone Encounter (Signed)
pls call pt: Advise She needs a little more thyroid hormone. I increased dose to 1 Tablet daily. Sent new script. Please schedule lab appt. In 6 weeks.

## 2014-08-16 ENCOUNTER — Other Ambulatory Visit: Payer: Self-pay

## 2014-08-16 DIAGNOSIS — E038 Other specified hypothyroidism: Secondary | ICD-10-CM

## 2014-08-16 DIAGNOSIS — E063 Autoimmune thyroiditis: Principal | ICD-10-CM

## 2014-08-16 MED ORDER — LEVOTHYROXINE SODIUM 25 MCG PO TABS: 25.0000 ug | ORAL_TABLET | Freq: Every day | ORAL | Status: DC

## 2014-08-16 NOTE — Telephone Encounter (Signed)
[facility] requesting 90 day supply.

## 2014-09-26 ENCOUNTER — Other Ambulatory Visit (INDEPENDENT_AMBULATORY_CARE_PROVIDER_SITE_OTHER): Payer: 59

## 2014-09-26 DIAGNOSIS — E039 Hypothyroidism, unspecified: Secondary | ICD-10-CM

## 2014-09-26 LAB — TSH: TSH: 2.79 u[IU]/mL (ref 0.35–4.50)

## 2014-09-27 ENCOUNTER — Other Ambulatory Visit: Payer: Self-pay

## 2014-09-27 ENCOUNTER — Telehealth: Payer: Self-pay | Admitting: Nurse Practitioner

## 2014-09-27 DIAGNOSIS — E038 Other specified hypothyroidism: Secondary | ICD-10-CM

## 2014-09-27 DIAGNOSIS — E063 Autoimmune thyroiditis: Principal | ICD-10-CM

## 2014-09-27 NOTE — Telephone Encounter (Signed)
pls call pt: Advise Thyroid hormone is at goal. No changes to meds. Keep January appt.

## 2014-09-27 NOTE — Telephone Encounter (Signed)
Left detailed message on voicemail about lab results.

## 2014-11-07 ENCOUNTER — Encounter: Payer: Self-pay | Admitting: Nurse Practitioner

## 2014-11-07 ENCOUNTER — Telehealth: Payer: Self-pay | Admitting: Nurse Practitioner

## 2014-11-07 ENCOUNTER — Ambulatory Visit (INDEPENDENT_AMBULATORY_CARE_PROVIDER_SITE_OTHER): Payer: Self-pay | Admitting: Nurse Practitioner

## 2014-11-07 VITALS — BP 115/74 | HR 71 | Temp 98.3°F | Resp 18 | Ht 66.0 in | Wt 136.0 lb

## 2014-11-07 DIAGNOSIS — E038 Other specified hypothyroidism: Secondary | ICD-10-CM

## 2014-11-07 DIAGNOSIS — E063 Autoimmune thyroiditis: Principal | ICD-10-CM

## 2014-11-07 LAB — TSH: TSH: 2.12 u[IU]/mL (ref 0.35–4.50)

## 2014-11-07 MED ORDER — LEVOTHYROXINE SODIUM 25 MCG PO TABS: 25.0000 ug | ORAL_TABLET | Freq: Every day | ORAL | Status: DC

## 2014-11-07 NOTE — Patient Instructions (Signed)
Continue thyroid med daily-1 hr before breakfast or coffee.  My office will call with lab results.  See you in 6 mos!  Good luck w/clinicals!

## 2014-11-07 NOTE — Telephone Encounter (Signed)
pls call pt: Advise No change needed to thyroid med. I will see her in 6 mos.  I refilled thyroid med.

## 2014-11-07 NOTE — Progress Notes (Signed)
Pre visit review using our clinic review tool, if applicable. No additional management support is needed unless otherwise documented below in the visit note. 

## 2014-11-07 NOTE — Progress Notes (Signed)
Subjective:     Patricia [name] is a 24 y.o. female who presents for follow up of hypothyroidism. Current symptoms: change in energy level and weight changes. Patient denies diarrhea, heat / cold intolerance, nervousness and palpitations. Pt thinks weight loss is due to cutting back sugar since her dad was diagnosed w/DM and she has had decreased appetite due to "boy troubles". She has felt tired but contributes to working until 11 pm then going to class at 8am. Increased thyroid med at last OV, Last TSH at goal.   The following portions of the patient's history were reviewed and updated as appropriate: allergies, current medications, past family history, past medical history, past social history, past surgical history and problem list.  Review of Systems Eyes: negative for visual disturbance Gastrointestinal: negative for nausea and reflux symptoms    Objective:    BP 115/74 mmHg  Pulse 71  Temp(Src) 98.3 F (36.8 C) (Oral)  Resp 18  Ht 5\' 6"  (1.676 m)  Wt 136 lb (61.689 kg)  BMI 21.96 kg/m2  SpO2 99%  LMP 10/31/2014 General appearance: alert, cooperative, appears stated age and no distress Head: Normocephalic, without obvious abnormality, atraumatic Eyes: negative findings: lids and lashes normal, conjunctivae and sclerae normal and wearing glasses Neck: no adenopathy, supple, symmetrical, trachea midline and thyroid not enlarged, symmetric, no tenderness/mass/nodules Lungs: clear to auscultation bilaterally Heart: regular rate and rhythm, S1, S2 normal, no murmur, click, rub or gallop Abdomen: soft, non-tender; bowel sounds normal; no masses,  no organomegaly  Laboratory: Lab Results  Component Value Date   TSH 2.79 09/26/2014      Assessment: Plan     1. Hypothyroidism due to Hashimoto's thyroiditis - TSH Continue current dose Monitor lab result F/u in 6 mos if no changes

## 2014-11-08 NOTE — Telephone Encounter (Signed)
Left detailed message on pt's vm

## 2014-11-17 ENCOUNTER — Ambulatory Visit: Payer: 59 | Admitting: Nurse Practitioner

## 2015-05-16 ENCOUNTER — Other Ambulatory Visit (INDEPENDENT_AMBULATORY_CARE_PROVIDER_SITE_OTHER): Payer: 59

## 2015-05-16 ENCOUNTER — Telehealth: Payer: Self-pay | Admitting: Family

## 2015-05-16 ENCOUNTER — Other Ambulatory Visit: Payer: Self-pay | Admitting: Family

## 2015-05-16 DIAGNOSIS — E063 Autoimmune thyroiditis: Secondary | ICD-10-CM

## 2015-05-16 DIAGNOSIS — E038 Other specified hypothyroidism: Secondary | ICD-10-CM

## 2015-05-16 LAB — TSH: TSH: 2.32 u[IU]/mL (ref 0.35–4.50)

## 2015-05-16 MED ORDER — LEVOTHYROXINE SODIUM 25 MCG PO TABS: 25.0000 ug | ORAL_TABLET | Freq: Every day | ORAL | Status: DC

## 2015-05-16 NOTE — Telephone Encounter (Signed)
Received TSH request from patient. New TSH is stable at 2.32. Takes the medication as prescribed and denies adverse side effects or fatigue. Continue current dosage of levothyroxine.

## 2015-06-25 ENCOUNTER — Other Ambulatory Visit: Payer: Self-pay | Admitting: Family

## 2015-06-25 MED ORDER — DOXYCYCLINE HYCLATE 100 MG PO TABS: 100.0000 mg | ORAL_TABLET | Freq: Two times a day (BID) | ORAL | Status: DC

## 2015-07-20 ENCOUNTER — Other Ambulatory Visit: Payer: 59

## 2015-07-24 ENCOUNTER — Other Ambulatory Visit: Payer: Self-pay | Admitting: Family

## 2015-07-24 ENCOUNTER — Other Ambulatory Visit: Payer: 59

## 2015-09-03 ENCOUNTER — Other Ambulatory Visit: Payer: Self-pay | Admitting: Family

## 2015-09-03 DIAGNOSIS — E038 Other specified hypothyroidism: Secondary | ICD-10-CM

## 2015-09-03 DIAGNOSIS — E063 Autoimmune thyroiditis: Principal | ICD-10-CM

## 2015-09-03 MED ORDER — LEVOTHYROXINE SODIUM 25 MCG PO TABS: 25.0000 ug | ORAL_TABLET | Freq: Every day | ORAL | Status: DC

## 2015-09-20 ENCOUNTER — Other Ambulatory Visit: Payer: Self-pay | Admitting: Family

## 2015-09-20 MED ORDER — LEVOFLOXACIN 500 MG PO TABS: 500.0000 mg | ORAL_TABLET | Freq: Every day | ORAL | Status: DC

## 2015-11-04 HISTORY — PX: MOHS SURGERY: SUR867

## 2015-11-14 MED FILL — BEYAZ 28 TABLET: 3-0.02-0.45 | 24 days supply | Qty: 28 | Fill #0

## 2015-11-15 DIAGNOSIS — Z113 Encounter for screening for infections with a predominantly sexual mode of transmission: Secondary | ICD-10-CM | POA: Diagnosis not present

## 2015-11-15 DIAGNOSIS — Z6823 Body mass index (BMI) 23.0-23.9, adult: Secondary | ICD-10-CM | POA: Diagnosis not present

## 2015-11-15 DIAGNOSIS — Z124 Encounter for screening for malignant neoplasm of cervix: Secondary | ICD-10-CM | POA: Diagnosis not present

## 2015-11-15 DIAGNOSIS — N944 Primary dysmenorrhea: Secondary | ICD-10-CM | POA: Diagnosis not present

## 2015-11-15 DIAGNOSIS — Z01419 Encounter for gynecological examination (general) (routine) without abnormal findings: Secondary | ICD-10-CM | POA: Diagnosis not present

## 2015-11-15 DIAGNOSIS — Z130 Encounter for screening for diseases of the blood and blood-forming organs and certain disorders involving the immune mechanism: Secondary | ICD-10-CM | POA: Diagnosis not present

## 2015-11-15 DIAGNOSIS — R87615 Unsatisfactory cytologic smear of cervix: Secondary | ICD-10-CM | POA: Diagnosis not present

## 2015-11-15 DIAGNOSIS — Z3041 Encounter for surveillance of contraceptive pills: Secondary | ICD-10-CM | POA: Diagnosis not present

## 2015-11-15 DIAGNOSIS — Z1389 Encounter for screening for other disorder: Secondary | ICD-10-CM | POA: Diagnosis not present

## 2015-12-05 ENCOUNTER — Other Ambulatory Visit: Payer: Self-pay

## 2015-12-05 DIAGNOSIS — E063 Autoimmune thyroiditis: Principal | ICD-10-CM

## 2015-12-05 DIAGNOSIS — E038 Other specified hypothyroidism: Secondary | ICD-10-CM

## 2015-12-05 MED ORDER — LEVOTHYROXINE SODIUM 25 MCG PO TABS: 25.0000 ug | ORAL_TABLET | Freq: Every day | ORAL | Status: DC

## 2015-12-05 MED FILL — LEVOTHYROXINE 25 MCG TABLET: 25 | 10 days supply | Qty: 10 | Fill #0

## 2015-12-10 MED FILL — DROSP-EE-LEVOMEF 3-0.02-0.4: 3-0.02-0.45 | 76 days supply | Qty: 84 | Fill #0

## 2015-12-13 ENCOUNTER — Encounter: Payer: Self-pay | Admitting: Family

## 2015-12-13 ENCOUNTER — Ambulatory Visit (INDEPENDENT_AMBULATORY_CARE_PROVIDER_SITE_OTHER): Payer: 59 | Admitting: Family

## 2015-12-13 VITALS — BP 108/78 | HR 84 | Temp 98.0°F | Resp 18 | Ht 66.0 in | Wt 151.0 lb

## 2015-12-13 DIAGNOSIS — Z0000 Encounter for general adult medical examination without abnormal findings: Secondary | ICD-10-CM

## 2015-12-13 NOTE — Patient Instructions (Addendum)
Thank you for choosing [facility].  Summary/Instructions:   Please stop by the lab on the basement level of the building for your blood work. Your results will be released to MyChart (or called to you) after review, usually within 72 hours after test completion. If any changes need to be made, you will be notified at that same time.  Health Maintenance, Female Adopting a healthy lifestyle and getting preventive care can go a long way to promote health and wellness. Talk with your health care provider about what schedule of regular examinations is right for you. This is a good chance for you to check in with your provider about disease prevention and staying healthy. In between checkups, there are plenty of things you can do on your own. Experts have done a lot of research about which lifestyle changes and preventive measures are most likely to keep you healthy. Ask your health care provider for more information. WEIGHT AND DIET  Eat a healthy diet  Be sure to include plenty of vegetables, fruits, low-fat dairy products, and lean protein.  Do not eat a lot of foods high in solid fats, added sugars, or salt.  Get regular exercise. This is one of the most important things you can do for your health.  Most adults should exercise for at least 150 minutes each week. The exercise should increase your heart rate and make you sweat (moderate-intensity exercise).  Most adults should also do strengthening exercises at least twice a week. This is in addition to the moderate-intensity exercise.  Maintain a healthy weight  Body mass index (BMI) is a measurement that can be used to identify possible weight problems. It estimates body fat based on height and weight. Your health care provider can help determine your BMI and help you achieve or maintain a healthy weight.  For females 20 years of age and older:   A BMI below 18.5 is considered underweight.  A BMI of 18.5 to 24.9 is normal.  A  BMI of 25 to 29.9 is considered overweight.  A BMI of 30 and above is considered obese.  Watch levels of cholesterol and blood lipids  You should start having your blood tested for lipids and cholesterol at 25 years of age, then have this test every 5 years.  You may need to have your cholesterol levels checked more often if:  Your lipid or cholesterol levels are high.  You are older than 25 years of age.  You are at high risk for heart disease.  CANCER SCREENING   Lung Cancer  Lung cancer screening is recommended for adults 55-80 years old who are at high risk for lung cancer because of a history of smoking.  A yearly low-dose CT scan of the lungs is recommended for people who:  Currently smoke.  Have quit within the past 15 years.  Have at least a 30-pack-year history of smoking. A pack year is smoking an average of one pack of cigarettes a day for 1 year.  Yearly screening should continue until it has been 15 years since you quit.  Yearly screening should stop if you develop a health problem that would prevent you from having lung cancer treatment.  Breast Cancer  Practice breast self-awareness. This means understanding how your breasts normally appear and feel.  It also means doing regular breast self-exams. Let your health care provider know about any changes, no matter how small.  If you are in your 20s or 30s, you should have a   breast exam (CBE) by a health care provider every 1-3 years as part of a regular health exam.  If you are 47 or older, have a CBE every year. Also consider having a breast X-ray (mammogram) every year.  If you have a family history of breast cancer, talk to your health care provider about genetic screening.  If you are at high risk for breast cancer, talk to your health care provider about having an MRI and a mammogram every year.  Breast cancer gene (BRCA) assessment is recommended for women who have family members with  BRCA-related cancers. BRCA-related cancers include:  Breast.  Ovarian.  Tubal.  Peritoneal cancers.  Results of the assessment will determine the need for genetic counseling and BRCA1 and BRCA2 testing. Cervical Cancer Your health care provider may recommend that you be screened regularly for cancer of the pelvic organs (ovaries, uterus, and vagina). This screening involves a pelvic examination, including checking for microscopic changes to the surface of your cervix (Pap test). You may be encouraged to have this screening done every 3 years, beginning at age 26.  For women ages 95-65, health care providers may recommend pelvic exams and Pap testing every 3 years, or they may recommend the Pap and pelvic exam, combined with testing for human papilloma virus (HPV), every 5 years. Some types of HPV increase your risk of cervical cancer. Testing for HPV may also be done on women of any age with unclear Pap test results.  Other health care providers may not recommend any screening for nonpregnant women who are considered low risk for pelvic cancer and who do not have symptoms. Ask your health care provider if a screening pelvic exam is right for you.  If you have had past treatment for cervical cancer or a condition that could lead to cancer, you need Pap tests and screening for cancer for at least 20 years after your treatment. If Pap tests have been discontinued, your risk factors (such as having a new sexual partner) need to be reassessed to determine if screening should resume. Some women have medical problems that increase the chance of getting cervical cancer. In these cases, your health care provider may recommend more frequent screening and Pap tests. Colorectal Cancer  This type of cancer can be detected and often prevented.  Routine colorectal cancer screening usually begins at 25 years of age and continues through 25 years of age.  Your health care provider may recommend screening at  an earlier age if you have risk factors for colon cancer.  Your health care provider may also recommend using home test kits to check for hidden blood in the stool.  A small camera at the end of a tube can be used to examine your colon directly (sigmoidoscopy or colonoscopy). This is done to check for the earliest forms of colorectal cancer.  Routine screening usually begins at age 60.  Direct examination of the colon should be repeated every 5-10 years through 25 years of age. However, you may need to be screened more often if early forms of precancerous polyps or small growths are found. Skin Cancer  Check your skin from head to toe regularly.  Tell your health care provider about any new moles or changes in moles, especially if there is a change in a mole's shape or color.  Also tell your health care provider if you have a mole that is larger than the size of a pencil eraser.  Always use sunscreen. Apply sunscreen liberally and  repeatedly throughout the day.  Protect yourself by wearing long sleeves, pants, a wide-brimmed hat, and sunglasses whenever you are outside. HEART DISEASE, DIABETES, AND HIGH BLOOD PRESSURE   High blood pressure causes heart disease and increases the risk of stroke. High blood pressure is more likely to develop in:  People who have blood pressure in the high end of the normal range (130-139/85-89 mm Hg).  People who are overweight or obese.  People who are African American.  If you are 6-75 years of age, have your blood pressure checked every 3-5 years. If you are 78 years of age or older, have your blood pressure checked every year. You should have your blood pressure measured twice--once when you are at a hospital or clinic, and once when you are not at a hospital or clinic. Record the average of the two measurements. To check your blood pressure when you are not at a hospital or clinic, you can use:  An automated blood pressure machine at a  pharmacy.  A home blood pressure monitor.  If you are between 33 years and 77 years old, ask your health care provider if you should take aspirin to prevent strokes.  Have regular diabetes screenings. This involves taking a blood sample to check your fasting blood sugar level.  If you are at a normal weight and have a low risk for diabetes, have this test once every three years after 25 years of age.  If you are overweight and have a high risk for diabetes, consider being tested at a younger age or more often. PREVENTING INFECTION  Hepatitis B  If you have a higher risk for hepatitis B, you should be screened for this virus. You are considered at high risk for hepatitis B if:  You were born in a country where hepatitis B is common. Ask your health care provider which countries are considered high risk.  Your parents were born in a high-risk country, and you have not been immunized against hepatitis B (hepatitis B vaccine).  You have HIV or AIDS.  You use needles to inject street drugs.  You live with someone who has hepatitis B.  You have had sex with someone who has hepatitis B.  You get hemodialysis treatment.  You take certain medicines for conditions, including cancer, organ transplantation, and autoimmune conditions. Hepatitis C  Blood testing is recommended for:  Everyone born from [birth year] through 1965.  Anyone with known risk factors for hepatitis C. Sexually transmitted infections (STIs)  You should be screened for sexually transmitted infections (STIs) including gonorrhea and chlamydia if:  You are sexually active and are younger than 25 years of age.  You are older than 25 years of age and your health care provider tells you that you are at risk for this type of infection.  Your sexual activity has changed since you were last screened and you are at an increased risk for chlamydia or gonorrhea. Ask your health care provider if you are at risk.  If you do not  have HIV, but are at risk, it may be recommended that you take a prescription medicine daily to prevent HIV infection. This is called pre-exposure prophylaxis (PrEP). You are considered at risk if:  You are sexually active and do not regularly use condoms or know the HIV status of your partner(s).  You take drugs by injection.  You are sexually active with a partner who has HIV. Talk with your health care provider about whether you are at  at high risk of being infected with HIV. If you choose to begin PrEP, you should first be tested for HIV. You should then be tested every 3 months for as long as you are taking PrEP.  PREGNANCY   If you are premenopausal and you may become pregnant, ask your health care provider about preconception counseling.  If you may become pregnant, take 400 to 800 micrograms (mcg) of folic acid every day.  If you want to prevent pregnancy, talk to your health care provider about birth control (contraception). OSTEOPOROSIS AND MENOPAUSE   Osteoporosis is a disease in which the bones lose minerals and strength with aging. This can result in serious bone fractures. Your risk for osteoporosis can be identified using a bone density scan.  If you are 65 years of age or older, or if you are at risk for osteoporosis and fractures, ask your health care provider if you should be screened.  Ask your health care provider whether you should take a calcium or vitamin D supplement to lower your risk for osteoporosis.  Menopause may have certain physical symptoms and risks.  Hormone replacement therapy may reduce some of these symptoms and risks. Talk to your health care provider about whether hormone replacement therapy is right for you.  HOME CARE INSTRUCTIONS   Schedule regular health, dental, and eye exams.  Stay current with your immunizations.   Do not use any tobacco products including cigarettes, chewing tobacco, or electronic cigarettes.  If you are pregnant, do not  drink alcohol.  If you are breastfeeding, limit how much and how often you drink alcohol.  Limit alcohol intake to no more than 1 drink per day for nonpregnant women. One drink equals 12 ounces of beer, 5 ounces of wine, or 1 ounces of hard liquor.  Do not use street drugs.  Do not share needles.  Ask your health care provider for help if you need support or information about quitting drugs.  Tell your health care provider if you often feel depressed.  Tell your health care provider if you have ever been abused or do not feel safe at home.   This information is not intended to replace advice given to you by your health care provider. Make sure you discuss any questions you have with your health care provider.   Document Released: 05/05/2011 Document Revised: 11/10/2014 Document Reviewed: 09/21/2013 Elsevier Interactive Patient Education 2016 [employer]    

## 2015-12-13 NOTE — Progress Notes (Signed)
Subjective:    Patient ID: Patricia [name], female    DOB: 07-09-1991, 25 y.o.   MRN: [id number]  Chief Complaint  Patient presents with  . CPE    Not fasting    HPI:  Patricia [name] is a 25 y.o. female who presents today for an annual wellness visit.   1) Health Maintenance -   Diet - Averages about 3 meals per day with snacks consisting of chicken, beef, pork, pasta, fruits and vegetables  Exercise - 2-3x per week to the gym for resistance training and cardio   2) Preventative Exams / Immunizations:  Dental -- Up to date  Vision -- Up to date   Health Maintenance  Topic Date Due  . INFLUENZA VACCINE  06/03/2016  . PAP SMEAR  11/14/2018  . TETANUS/TDAP  04/03/2025  . HIV Screening  Completed    Immunization History  Administered Date(s) Administered  . DTaP 07/25/1994, 09/17/1994, 11/12/1994, 09/07/1998  . HPV Quadrivalent 12/24/2005  . Hepatitis B 05/07/1994, 09/07/1994, 11/12/1994, 07/31/2003, 09/04/2003  . HiB (PRP-OMP) 07/25/1994, 09/17/1994, 11/12/1994, 11/17/1995  . IPV 07/25/1994, 09/17/1994, 11/12/1994, 11/17/1995, 09/07/1998  . Influenza Split 07/29/2010, 09/03/2013  . Influenza,inj,Quad PF,36+ Mos 08/14/2014  . MMR 07/28/1995, 09/07/1998  . PPD Test 10/31/2013, 11/07/2013  . Td 12/20/2004  . Tdap 03/05/2010  . Varicella 03/05/2010    Allergies  Allergen Reactions  . Amoxicillin Nausea And Vomiting     Outpatient Prescriptions Prior to Visit  Medication Sig Dispense Refill  . BEYAZ 3-0.02-0.451 MG tablet     . levothyroxine (SYNTHROID, LEVOTHROID) 25 MCG tablet Take 1 tablet (25 mcg total) by mouth daily before breakfast. 10 tablet 0  . levofloxacin (LEVAQUIN) 500 MG tablet Take 1 tablet (500 mg total) by mouth daily. 7 tablet 0   No facility-administered medications prior to visit.     Past Medical History  Diagnosis Date  . Chicken pox   . Thyroid disease      Past Surgical History  Procedure Laterality Date  . Wisdom tooth  extraction       Family History  Problem Relation Age of Onset  . Arthritis Father   . Hypertension Father   . Thyroid disease Father   . Diabetes Father   . Depression Mother   . Thyroid disease Sister   . Healthy Maternal Grandmother   . Diabetes Maternal Grandfather   . Hypertension Maternal Grandfather   . COPD Maternal Grandfather   . Hypertension Paternal Grandmother   . Diabetes Paternal Grandfather      Social History   Social History  . Marital Status: Single    Spouse Name: N/A  . Number of Children: 0  . Years of Education: 14   Occupational History  . CMA    Social History Main Topics  . Smoking status: Never Smoker   . Smokeless tobacco: Never Used  . Alcohol Use: 1.8 oz/week    1 Glasses of wine, 1 Cans of beer, 1 Shots of liquor per week  . Drug Use: No  . Sexual Activity: Yes    Birth Control/ Protection: Pill   Other Topics Concern  . Not on file   Social History Narrative   Ms. Gains lives with her father & grandmother.    Denies abuse and feels safe at home.     Review of Systems  Constitutional: Denies fever, chills, fatigue, or significant weight gain/loss. HENT: Head: Denies headache or neck pain Ears: Denies changes in hearing, ringing in ears, earache,  drainage Nose: Denies discharge, stuffiness, itching, nosebleed, sinus pain Throat: Denies sore throat, hoarseness, dry mouth, sores, thrush Eyes: Denies loss/changes in vision, pain, redness, blurry/double vision, flashing lights Cardiovascular: Denies chest pain/discomfort, tightness, palpitations, shortness of breath with activity, difficulty lying down, swelling, sudden awakening with shortness of breath Respiratory: Denies shortness of breath, cough, sputum production, wheezing Gastrointestinal: Denies dysphasia, heartburn, change in appetite, nausea, change in bowel habits, rectal bleeding, constipation, diarrhea, yellow skin or eyes Genitourinary: Denies frequency, urgency,  burning/pain, blood in urine, incontinence, change in urinary strength. Musculoskeletal: Denies muscle/joint pain, stiffness, back pain, redness or swelling of joints, trauma Skin: Denies rashes, lumps, itching, dryness, color changes, or hair/nail changes Neurological: Denies dizziness, fainting, seizures, weakness, numbness, tingling, tremor Psychiatric - Denies nervousness, stress, depression or memory loss Endocrine: Denies heat or cold intolerance, sweating, frequent urination, excessive thirst, changes in appetite Hematologic: Denies ease of bruising or bleeding     Objective:    BP 108/78 mmHg  Pulse 84  Temp(Src) 98 F (36.7 C) (Oral)  Resp 18  Ht 5' 6"  (1.676 m)  Wt 151 lb (68.493 kg)  BMI 24.38 kg/m2  SpO2 98% Nursing note and vital signs reviewed.  Physical Exam  Constitutional: She is oriented to person, place, and time. She appears well-developed and well-nourished.  HENT:  Head: Normocephalic.  Right Ear: Hearing, tympanic membrane, external ear and ear canal normal.  Left Ear: Hearing, tympanic membrane, external ear and ear canal normal.  Nose: Nose normal.  Mouth/Throat: Uvula is midline, oropharynx is clear and moist and mucous membranes are normal.  Eyes: Conjunctivae and EOM are normal. Pupils are equal, round, and reactive to light.  Neck: Neck supple. No JVD present. No tracheal deviation present. No thyromegaly present.  Cardiovascular: Normal rate, regular rhythm, normal heart sounds and intact distal pulses.   Pulmonary/Chest: Effort normal and breath sounds normal.  Abdominal: Soft. Bowel sounds are normal. She exhibits no distension and no mass. There is no tenderness. There is no rebound and no guarding.  Musculoskeletal: Normal range of motion. She exhibits no edema or tenderness.  Lymphadenopathy:    She has no cervical adenopathy.  Neurological: She is alert and oriented to person, place, and time. She has normal reflexes. No cranial nerve deficit.  She exhibits normal muscle tone. Coordination normal.  Skin: Skin is warm and dry.  Psychiatric: She has a normal mood and affect. Her behavior is normal. Judgment and thought content normal.       Assessment & Plan:   Problem List Items Addressed This Visit      Other   Routine general medical examination at a health care facility - Primary    1) Anticipatory Guidance: Discussed importance of wearing a seatbelt while driving and not texting while driving; changing batteries in smoke detector at least once annually; wearing suntan lotion when outside; eating a balanced and moderate diet; getting physical activity at least 30 minutes per day.  2) Immunizations / Screenings / Labs:  All immunizations are up-to-date per recommendations. All screenings are up-to-date per recommendations. Obtain CBC, CMET, Lipid profile and TSH.   Overall well exam with minimal risk factors for cardiovascular disease. She is of good weight and exercises regularly. Continue consuming a diet that is balanced, married, and moderate and low in saturated fats. Continue healthy lifestyle behaviors and choices. Follow-up prevention exam in 1-2 years. Follow-up office visit pending lab work as needed or for hypothyroidism.      Relevant Orders  TSH   Lipid panel   Comprehensive metabolic panel   CBC

## 2015-12-13 NOTE — Progress Notes (Signed)
Pre visit review using our clinic review tool, if applicable. No additional management support is needed unless otherwise documented below in the visit note. 

## 2015-12-13 NOTE — Assessment & Plan Note (Signed)
1) Anticipatory Guidance: Discussed importance of wearing a seatbelt while driving and not texting while driving; changing batteries in smoke detector at least once annually; wearing suntan lotion when outside; eating a balanced and moderate diet; getting physical activity at least 30 minutes per day.  2) Immunizations / Screenings / Labs:  All immunizations are up-to-date per recommendations. All screenings are up-to-date per recommendations. Obtain CBC, CMET, Lipid profile and TSH.   Overall well exam with minimal risk factors for cardiovascular disease. She is of good weight and exercises regularly. Continue consuming a diet that is balanced, married, and moderate and low in saturated fats. Continue healthy lifestyle behaviors and choices. Follow-up prevention exam in 1-2 years. Follow-up office visit pending lab work as needed or for hypothyroidism.

## 2015-12-14 ENCOUNTER — Other Ambulatory Visit (INDEPENDENT_AMBULATORY_CARE_PROVIDER_SITE_OTHER): Payer: 59

## 2015-12-14 DIAGNOSIS — Z0000 Encounter for general adult medical examination without abnormal findings: Secondary | ICD-10-CM

## 2015-12-14 LAB — LIPID PANEL
Cholesterol: 182 mg/dL (ref 0–200)
HDL: 64.4 mg/dL (ref >39.00)
LDL Cholesterol: 97 mg/dL (ref 0–99)
NONHDL: 117.36
TRIGLYCERIDES: 103 mg/dL (ref 0.0–149.0)
Total CHOL/HDL Ratio: 3
VLDL: 20.6 mg/dL (ref 0.0–40.0)

## 2015-12-14 LAB — COMPREHENSIVE METABOLIC PANEL
ALK PHOS: 44 U/L (ref 39–117)
ALT: 6 U/L (ref 0–35)
AST: 14 U/L (ref 0–37)
Albumin: 3.8 g/dL (ref 3.5–5.2)
BILIRUBIN TOTAL: 0.6 mg/dL (ref 0.2–1.2)
BUN: 16 mg/dL (ref 6–23)
CALCIUM: 9.2 mg/dL (ref 8.4–10.5)
CO2: 26 mEq/L (ref 19–32)
CREATININE: 0.87 mg/dL (ref 0.40–1.20)
Chloride: 105 mEq/L (ref 96–112)
GFR: 84.38 mL/min (ref >60.00)
GLUCOSE: 85 mg/dL (ref 70–99)
Potassium: 4.1 mEq/L (ref 3.5–5.1)
Sodium: 139 mEq/L (ref 135–145)
TOTAL PROTEIN: 7.2 g/dL (ref 6.0–8.3)

## 2015-12-14 LAB — TSH: TSH: 3.84 u[IU]/mL (ref 0.35–4.50)

## 2015-12-15 LAB — CBC
HCT: 38.2 % (ref 36.0–46.0)
Hemoglobin: 12.2 g/dL (ref 12.0–15.0)
MCHC: 31.9 g/dL (ref 30.0–36.0)
MCV: 90.1 fl (ref 78.0–100.0)
PLATELETS: 271 10*3/uL (ref 150.0–400.0)
RBC: 4.24 Mil/uL (ref 3.87–5.11)
RDW: 12.7 % (ref 11.5–15.5)
WBC: 4.5 10*3/uL (ref 4.0–10.5)

## 2015-12-19 ENCOUNTER — Telehealth: Payer: Self-pay | Admitting: Family

## 2015-12-19 DIAGNOSIS — E038 Other specified hypothyroidism: Secondary | ICD-10-CM

## 2015-12-19 DIAGNOSIS — E063 Autoimmune thyroiditis: Principal | ICD-10-CM

## 2015-12-19 MED ORDER — LEVOTHYROXINE SODIUM 50 MCG PO TABS: 50.0000 ug | ORAL_TABLET | Freq: Every day | ORAL | Status: DC

## 2015-12-19 MED FILL — LEVOTHYROXINE 50 MCG TABLET: 50 | 30 days supply | Qty: 30 | Fill #0

## 2015-12-19 NOTE — Telephone Encounter (Signed)
Reviewed results with patient and increased levothyroxine. Follow up in 6 weeks.

## 2016-01-21 MED FILL — LEVOTHYROXINE 50 MCG TABLET: 50 | 30 days supply | Qty: 30 | Fill #1

## 2016-02-18 ENCOUNTER — Other Ambulatory Visit (INDEPENDENT_AMBULATORY_CARE_PROVIDER_SITE_OTHER): Payer: 59

## 2016-02-18 ENCOUNTER — Other Ambulatory Visit: Payer: Self-pay | Admitting: Family

## 2016-02-18 DIAGNOSIS — Z5181 Encounter for therapeutic drug level monitoring: Secondary | ICD-10-CM

## 2016-02-18 DIAGNOSIS — E038 Other specified hypothyroidism: Secondary | ICD-10-CM

## 2016-02-18 DIAGNOSIS — E039 Hypothyroidism, unspecified: Secondary | ICD-10-CM

## 2016-02-18 DIAGNOSIS — E063 Autoimmune thyroiditis: Secondary | ICD-10-CM

## 2016-02-18 LAB — TSH: TSH: 3.06 u[IU]/mL (ref 0.35–4.50)

## 2016-02-18 MED ORDER — LEVOTHYROXINE SODIUM 75 MCG PO TABS: 75.0000 ug | ORAL_TABLET | Freq: Every day | ORAL | Status: DC

## 2016-02-18 MED FILL — LEVOTHYROXINE 75 MCG TABLET: 75 | 30 days supply | Qty: 30 | Fill #0

## 2016-02-18 MED FILL — DROSP-EE-LEVOMEF 3-0.02-0.4: 3-0.02-0.45 | 76 days supply | Qty: 84 | Fill #1

## 2016-02-27 ENCOUNTER — Other Ambulatory Visit: Payer: Self-pay

## 2016-02-27 DIAGNOSIS — L708 Other acne: Secondary | ICD-10-CM

## 2016-03-06 DIAGNOSIS — C44321 Squamous cell carcinoma of skin of nose: Secondary | ICD-10-CM | POA: Diagnosis not present

## 2016-03-06 DIAGNOSIS — L738 Other specified follicular disorders: Secondary | ICD-10-CM | POA: Diagnosis not present

## 2016-03-06 MED FILL — CLINDAMYCIN PH 1% SOLUTION: 1 | 25 days supply | Qty: 60 | Fill #0

## 2016-03-21 DIAGNOSIS — C44311 Basal cell carcinoma of skin of nose: Secondary | ICD-10-CM | POA: Diagnosis not present

## 2016-03-24 MED FILL — LEVOTHYROXINE 75 MCG TABLET: 75 | 30 days supply | Qty: 30 | Fill #1

## 2016-04-21 ENCOUNTER — Other Ambulatory Visit: Payer: Self-pay | Admitting: Family

## 2016-04-21 DIAGNOSIS — Z5181 Encounter for therapeutic drug level monitoring: Secondary | ICD-10-CM

## 2016-04-21 DIAGNOSIS — E039 Hypothyroidism, unspecified: Secondary | ICD-10-CM

## 2016-04-22 ENCOUNTER — Other Ambulatory Visit (INDEPENDENT_AMBULATORY_CARE_PROVIDER_SITE_OTHER): Payer: 59

## 2016-04-22 DIAGNOSIS — Z5181 Encounter for therapeutic drug level monitoring: Secondary | ICD-10-CM

## 2016-04-22 DIAGNOSIS — E039 Hypothyroidism, unspecified: Secondary | ICD-10-CM

## 2016-04-22 LAB — TSH: TSH: 0.76 u[IU]/mL (ref 0.35–4.50)

## 2016-04-23 ENCOUNTER — Other Ambulatory Visit: Payer: Self-pay | Admitting: Family

## 2016-04-23 DIAGNOSIS — E038 Other specified hypothyroidism: Secondary | ICD-10-CM

## 2016-04-23 DIAGNOSIS — E063 Autoimmune thyroiditis: Principal | ICD-10-CM

## 2016-04-23 MED ORDER — LEVOTHYROXINE SODIUM 75 MCG PO TABS: 75.0000 ug | ORAL_TABLET | Freq: Every day | ORAL | Status: DC

## 2016-04-23 MED FILL — LEVOTHYROXINE 75 MCG TABLET: 75 | 90 days supply | Qty: 90 | Fill #0

## 2016-05-01 MED FILL — DROSP-EE-LEVOMEF 3-0.02-0.4: 3-0.02-0.45 | 76 days supply | Qty: 84 | Fill #2

## 2016-05-14 MED FILL — NAPROXEN SODIUM 550 MG TAB: 550 | 15 days supply | Qty: 30 | Fill #0

## 2016-05-19 ENCOUNTER — Ambulatory Visit: Payer: Self-pay

## 2016-05-19 ENCOUNTER — Ambulatory Visit (INDEPENDENT_AMBULATORY_CARE_PROVIDER_SITE_OTHER): Payer: 59 | Admitting: Family Medicine

## 2016-05-19 DIAGNOSIS — M79661 Pain in right lower leg: Secondary | ICD-10-CM

## 2016-05-19 DIAGNOSIS — S8011XA Contusion of right lower leg, initial encounter: Secondary | ICD-10-CM | POA: Diagnosis not present

## 2016-05-19 NOTE — Progress Notes (Signed)
[name] [name], [place] [address] Phone: [phone] Subjective:     CC: Right leg pain  [id number] Patricia [name] is a 25 y.o. female coming in with complaint of right leg pain. Patient was walking up her break stairs and was unable to get herself. Fell directly onto a Burkes tear. Had significant pain immediately on the anterior shin. Patient states that in regards bleeding. Patient did put a pad on it. Patient states that it had difficulty walking immediately. Starting to improve a little bit. Patient rates the severity of pain is 8 out of 10. Has not taken any ibuprofen yet. Has been icing it with very minimal improvement. Patient has not injured this knee previously. No numbness. Patient denies any weakness of the leg.     Past Medical History  Diagnosis Date  . Chicken pox   . Thyroid disease    Past Surgical History  Procedure Laterality Date  . Wisdom tooth extraction     Social History   Social History  . Marital Status: Single    Spouse Name: N/A  . Number of Children: 0  . Years of Education: 14   Occupational History  . CMA    Social History Main Topics  . Smoking status: Never Smoker   . Smokeless tobacco: Never Used  . Alcohol Use: 1.8 oz/week    1 Glasses of wine, 1 Cans of beer, 1 Shots of liquor per week  . Drug Use: No  . Sexual Activity: Yes    Birth Control/ Protection: Pill   Other Topics Concern  . Not on file   Social History Narrative   [name] lives with her father & grandmother.    Denies abuse and feels safe at home.    Allergies  Allergen Reactions  . Amoxicillin Nausea And Vomiting   Family History  Problem Relation Age of Onset  . Arthritis Father   . Hypertension Father   . Thyroid disease Father   . Diabetes Father   . Depression Mother   . Thyroid disease Sister   . Healthy Maternal Grandmother   . Diabetes Maternal Grandfather   . Hypertension Maternal  Grandfather   . COPD Maternal Grandfather   . Hypertension Paternal Grandmother   . Diabetes Paternal Grandfather     Past medical history, social, surgical and family history all reviewed in electronic medical record.  No pertanent information unless stated regarding to the chief complaint.   Review of Systems: No headache, visual changes, nausea, vomiting, diarrhea, constipation, dizziness, abdominal pain, skin rash, fevers, chills, night sweats, weight loss, swollen lymph nodes, body aches, joint swelling, muscle aches, chest pain, shortness of breath, mood changes.   Objective There were no vitals taken for this visit.  General: No apparent distress alert and oriented x3 mood and affect normal, dressed appropriately.  HEENT: Pupils equal, extraocular movements intact  Respiratory: Patient's speak in full sentences and does not appear short of breath  Cardiovascular: No lower extremity edema, non tender, no erythema  Skin: Warm dry intact with no signs of infection or rash on extremities or on axial skeleton.  Abdomen: Soft nontender  Neuro: Cranial nerves II through XII are intact, neurovascularly intact in all extremities with 2+ DTRs and 2+ pulses.  Lymph: No lymphadenopathy of posterior or anterior cervical chain or axillae bilaterally.  Gait normal with good balance and coordination.  MSK:  Non tender with full range of motion and good stability and  symmetric strength and tone of shoulders, elbows, wrist, hip, knee and ankles bilaterally.  Patient's anterior leg on the right leg does have a longitudinal abrasion. Posterior skin but not subcutaneous. Very superficial and no bleeding. Neurovascular intact distally. Full range motion of the ankle. Patient does have some swelling distal to the area of abrasion. Tender to palpation.  Limited muscular skeletal ultrasound was performed and interpreted by Hulan Saas, M  Limited ultrasound shows the patient does have swelling of the  subcutaneous tissue but no significant muscle injury and no cortical defect. Around patient's abrasion to shows hypoechoic changes and increasing Doppler flow. Very nonspecific. Impression: Leg contusion        Impression and Recommendations:     This case required medical decision making of moderate complexity.      Note: This dictation was prepared with Dragon dictation along with smaller phrase technology. Any transcriptional errors that result from this process are unintentional.

## 2016-05-19 NOTE — Assessment & Plan Note (Signed)
Patient does have a contusion of the right leg. Patient does have a small abrasion as well. She will watch for any signs of infectious etiology. Patient will likely respond to anti-inflammatories, icing as well as compression. Patient given any suggestions. We discussed proper shoewear. We discussed avoiding any high impact activity for the next 2 weeks. Patient will follow-up again worsening symptoms. Patient given warning signs for any compartment syndrome that is below likelihood.

## 2016-05-19 NOTE — Patient Instructions (Signed)
Good to see you  Stay straight up  ICe 20 minutes 4 times a day  Duexis 3 times a day for 3 days Stop by on firday to make sure good.  If numbness or weakness come back immediately.

## 2016-07-01 DIAGNOSIS — H5213 Myopia, bilateral: Secondary | ICD-10-CM | POA: Diagnosis not present

## 2016-07-17 MED FILL — DROSP-EE-LEVOMEF 3-0.02-0.4: 3-0.02-0.45 | 76 days supply | Qty: 84 | Fill #3

## 2016-07-25 MED FILL — LEVOTHYROXINE 75 MCG TABLET: 75 | 90 days supply | Qty: 90 | Fill #1

## 2016-09-29 MED FILL — DROSP-EE-LEVOMEF 3-0.02-0.4: 3-0.02-0.45 | 24 days supply | Qty: 28 | Fill #0

## 2016-10-09 ENCOUNTER — Telehealth: Payer: Self-pay

## 2016-10-09 MED ORDER — BENZONATATE 200 MG PO CAPS: 200.0000 mg | ORAL_CAPSULE | Freq: Three times a day (TID) | ORAL | 0 refills | Status: DC | PRN

## 2016-10-09 MED FILL — BENZONATATE 200 MG CAPSULE: 200 | 10 days supply | Qty: 30 | Fill #0

## 2016-10-09 NOTE — Telephone Encounter (Signed)
error 

## 2016-10-10 ENCOUNTER — Other Ambulatory Visit: Payer: Self-pay | Admitting: Internal Medicine

## 2016-10-10 MED ORDER — DOXYCYCLINE HYCLATE 100 MG PO TABS: 100.0000 mg | ORAL_TABLET | Freq: Two times a day (BID) | ORAL | 0 refills | Status: DC

## 2016-10-10 MED FILL — DOXYCYCLINE HYCLATE 100 MG: 100 | 10 days supply | Qty: 20 | Fill #0

## 2016-10-21 ENCOUNTER — Telehealth: Payer: Self-pay

## 2016-10-21 DIAGNOSIS — E063 Autoimmune thyroiditis: Principal | ICD-10-CM

## 2016-10-21 DIAGNOSIS — E038 Other specified hypothyroidism: Secondary | ICD-10-CM

## 2016-10-21 MED ORDER — LEVOTHYROXINE SODIUM 75 MCG PO TABS: 75.0000 ug | ORAL_TABLET | Freq: Every day | ORAL | 1 refills | Status: DC

## 2016-10-21 MED FILL — LEVOTHYROXINE 75 MCG TABLET: 75 | 90 days supply | Qty: 90 | Fill #0

## 2016-10-21 NOTE — Telephone Encounter (Signed)
Medication refill sent to pharmacy  

## 2016-10-22 MED FILL — DROSP-EE-LEVOMEF 3-0.02-0.4: 3-0.02-0.45 | 24 days supply | Qty: 28 | Fill #1

## 2016-11-17 MED FILL — DROSP-EE-LEVOMEF 3-0.02-0.4: 3-0.02-0.45 | 24 days supply | Qty: 28 | Fill #0

## 2016-11-20 DIAGNOSIS — Z130 Encounter for screening for diseases of the blood and blood-forming organs and certain disorders involving the immune mechanism: Secondary | ICD-10-CM | POA: Diagnosis not present

## 2016-11-20 DIAGNOSIS — Z1389 Encounter for screening for other disorder: Secondary | ICD-10-CM | POA: Diagnosis not present

## 2016-11-20 DIAGNOSIS — Z3041 Encounter for surveillance of contraceptive pills: Secondary | ICD-10-CM | POA: Diagnosis not present

## 2016-11-20 DIAGNOSIS — Z01419 Encounter for gynecological examination (general) (routine) without abnormal findings: Secondary | ICD-10-CM | POA: Diagnosis not present

## 2016-11-20 DIAGNOSIS — Z6824 Body mass index (BMI) 24.0-24.9, adult: Secondary | ICD-10-CM | POA: Diagnosis not present

## 2016-12-16 MED FILL — DROSP-EE-LEVOMEF 3-0.02-0.4: 3-0.02-0.45 | 70 days supply | Qty: 84 | Fill #0

## 2016-12-31 ENCOUNTER — Ambulatory Visit: Payer: Self-pay

## 2016-12-31 ENCOUNTER — Encounter: Payer: Self-pay | Admitting: Family Medicine

## 2016-12-31 ENCOUNTER — Ambulatory Visit (INDEPENDENT_AMBULATORY_CARE_PROVIDER_SITE_OTHER): Payer: 59 | Admitting: Family Medicine

## 2016-12-31 DIAGNOSIS — M25512 Pain in left shoulder: Secondary | ICD-10-CM

## 2016-12-31 DIAGNOSIS — M940 Chondrocostal junction syndrome [Tietze]: Secondary | ICD-10-CM

## 2016-12-31 DIAGNOSIS — M999 Biomechanical lesion, unspecified: Secondary | ICD-10-CM | POA: Insufficient documentation

## 2016-12-31 MED ORDER — MELOXICAM 15 MG PO TABS: 15.0000 mg | ORAL_TABLET | Freq: Every day | ORAL | 0 refills | Status: DC

## 2016-12-31 MED FILL — MELOXICAM 15 MG TABLET: 15 | 30 days supply | Qty: 30 | Fill #0

## 2016-12-31 NOTE — Assessment & Plan Note (Signed)
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 We discussed which activities doing which was to avoid. Discussed proper lifting mechanics. Responded well to medication. Follow-up again in 4-6 weeks.

## 2016-12-31 NOTE — Patient Instructions (Signed)
Good to see you.  Ice 20 minutes 2 times daily. Usually after activity and before bed. Exercises 3 times a week.  meloxicam daily for 5 days then as needed.  On wall with heels, butt shoulder and head touching for a goal of 5 minutes daily  See me again when you need me or talk to me if we need to inject the shoulder.

## 2016-12-31 NOTE — Progress Notes (Signed)
[name] [place], [place] [address] Phone: [phone] Subjective:    I'm seeing this patient by the request  of:  Binnie Rail, MD   CC: Left shoulder pain  [id number]  Patricia [name] is a 26 y.o. female coming in with complaint of left shoulder pain. States that it seems to be worsening over the course last several weeks. Does not recovery true injury. Patient has been doing some more lifting and trying to work out on a more regular basis. States that the pain is severe even at night. Things like movements or even deep breaths can cause pain. Has had mild radiation down the arm. Denies any weakness. Starting to affect her daily activities. Rates the severity of pain a 6 out of 10 and worse.     Past Medical History:  Diagnosis Date  . Chicken pox   . Thyroid disease    Past Surgical History:  Procedure Laterality Date  . WISDOM TOOTH EXTRACTION     Social History   Social History  . Marital status: Single    Spouse name: N/A  . Number of children: 0  . Years of education: 14   Occupational History  . [employer]   Social History Main Topics  . Smoking status: Never Smoker  . Smokeless tobacco: Never Used  . Alcohol use 1.8 oz/week    1 Glasses of wine, 1 Cans of beer, 1 Shots of liquor per week  . Drug use: No  . Sexual activity: Yes    Birth control/ protection: Pill   Other Topics Concern  . Not on file   Social History Narrative   Ms. Patch lives with her father & grandmother.    Denies abuse and feels safe at home.    Allergies  Allergen Reactions  . Amoxicillin Nausea And Vomiting   Family History  Problem Relation Age of Onset  . Arthritis Father   . Hypertension Father   . Thyroid disease Father   . Diabetes Father   . Depression Mother   . Thyroid disease Sister   . Healthy Maternal Grandmother   . Diabetes Maternal Grandfather   . Hypertension Maternal Grandfather     . COPD Maternal Grandfather   . Hypertension Paternal Grandmother   . Diabetes Paternal Grandfather     Past medical history, social, surgical and family history all reviewed in electronic medical record.  No pertanent information unless stated regarding to the chief complaint.   Review of Systems:Review of systems updated and as accurate as of 12/31/16  No headache, visual changes, nausea, vomiting, diarrhea, constipation, dizziness, abdominal pain, skin rash, fevers, chills, night sweats, weight loss, swollen lymph nodes, body aches, joint swelling, muscle aches, chest pain, shortness of breath, mood changes.   Objective  There were no vitals taken for this visit. Systems examined below as of 12/31/16   General: No apparent distress alert and oriented x3 mood and affect normal, dressed appropriately.  HEENT: Pupils equal, extraocular movements intact  Respiratory: Patient's speak in full sentences and does not appear short of breath  Cardiovascular: No lower extremity edema, non tender, no erythema  Skin: Warm dry intact with no signs of infection or rash on extremities or on axial skeleton.  Abdomen: Soft nontender  Neuro: Cranial nerves II through XII are intact, neurovascularly intact in all extremities with 2+ DTRs and 2+ pulses.  Lymph: No lymphadenopathy of posterior or anterior cervical chain or  axillae bilaterally.  Gait normal with good balance and coordination.  MSK:  Non tender with full range of motion and good stability and symmetric strength and tone of , elbows, wrist, hip, knee and ankles bilaterally.  Shoulder: left Inspection reveals no abnormalities, atrophy or asymmetry. Palpation is normal with no tenderness over AC joint or bicipital groove. ROM is full in all planes passively. Rotator cuff strength normal throughout. signs of impingement with positive Neer and Hawkin's tests, but negative empty can sign. Speeds and Yergason's tests normal. No labral pathology  noted with negative Obrien's, negative clunk and good stability. Mild scapular dyskinesia noted No painful arc and no drop arm sign. No apprehension sign  MSK US performed of: left This study was ordered, performed, and interpreted by Charlann Boxer D.O.  Shoulder:   Supraspinatus:  Appears normal on long and transverse views, Bursal bulge seen with shoulder abduction on impingement view. Infraspinatus:  Appears normal on long and transverse views. Significant increase in Doppler flow Subscapularis:  Appears normal on long and transverse views. Positive bursa Teres Minor:  Appears normal on long and transverse views. AC joint:  Capsule undistended, no geyser sign. Glenohumeral Joint:  Appears normal without effusion. Glenoid Labrum:  Intact without visualized tears. Biceps Tendon:  Appears normal on long and transverse views, no fraying of tendon, tendon located in intertubercular groove, no subluxation with shoulder internal or external rotation.  Impression: Subacromial bursitis  Osteopathic findings Cervical C2 flexed rotated and side bent right C4 flexed rotated and side bent left C6 flexed rotated and side bent left T3 extended rotated and side bent left inhaled third rib T9 extended rotated and side bent left L2 flexed rotated and side bent right Sacrum right on right    Impression and Recommendations:     This case required medical decision making of moderate complexity.      Note: This dictation was prepared with Dragon dictation along with smaller phrase technology. Any transcriptional errors that result from this process are unintentional.

## 2016-12-31 NOTE — Assessment & Plan Note (Addendum)
Decision today to treat with OMT was based on Physical Exam  After verbal consent patient was treated with HVLA, ME, FPR techniques in , thoracic, rib areas  Patient tolerated the procedure well with improvement in symptoms  Patient given exercises, stretches and lifestyle modifications  See medications in patient instructions if given  Patient will follow up in 4-6 weeks

## 2017-01-09 ENCOUNTER — Other Ambulatory Visit: Payer: Self-pay | Admitting: Internal Medicine

## 2017-01-09 MED ORDER — LEVOFLOXACIN 500 MG PO TABS: 500.0000 mg | ORAL_TABLET | Freq: Every day | ORAL | 0 refills | Status: DC

## 2017-01-09 MED FILL — levoFLOXacin 500 MG TABS: 500 | 7 days supply | Qty: 7 | Fill #0

## 2017-01-22 ENCOUNTER — Encounter: Payer: Self-pay | Admitting: Internal Medicine

## 2017-01-22 ENCOUNTER — Ambulatory Visit (INDEPENDENT_AMBULATORY_CARE_PROVIDER_SITE_OTHER): Payer: 59 | Admitting: Internal Medicine

## 2017-01-22 VITALS — BP 110/78 | HR 80 | Temp 97.6°F | Ht 66.0 in | Wt 152.0 lb

## 2017-01-22 DIAGNOSIS — Z0000 Encounter for general adult medical examination without abnormal findings: Secondary | ICD-10-CM

## 2017-01-22 DIAGNOSIS — E039 Hypothyroidism, unspecified: Secondary | ICD-10-CM | POA: Diagnosis not present

## 2017-01-22 DIAGNOSIS — Z85828 Personal history of other malignant neoplasm of skin: Secondary | ICD-10-CM | POA: Insufficient documentation

## 2017-01-22 NOTE — Progress Notes (Signed)
Pre visit review using our clinic review tool, if applicable. No additional management support is needed unless otherwise documented below in the visit note. 

## 2017-01-22 NOTE — Patient Instructions (Signed)
  Test(s) ordered today. Your results will be released to MyChart (or called to you) after review, usually within 72hours after test completion. If any changes need to be made, you will be notified at that same time.  All other Health Maintenance issues reviewed.   All recommended immunizations and age-appropriate screenings are up-to-date or discussed.  No immunizations administered today.   Medications reviewed and updated.  No changes recommended at this time.     

## 2017-01-22 NOTE — Progress Notes (Signed)
Subjective:    Patient ID: Patricia [name], female    DOB: 11/11/1990, 26 y.o.   MRN: [id number]  HPI She is here for a physical exam.   She has no concerns.   Medications and allergies reviewed with patient and updated if appropriate.  Patient Active Problem List   Diagnosis Date Noted  . Hx of skin cancer, basal cell 01/22/2017  . 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 Nonallopathic lesion of rib cage 12/31/2016  . Nonallopathic lesion of thoracic region 12/31/2016  . Contusion of leg, right 05/19/2016  . Routine general medical examination at a health care facility 12/13/2015  . Back pain, chronic 10/31/2013    Current Outpatient Prescriptions on File Prior to Visit  Medication Sig Dispense Refill  . BEYAZ 3-0.02-0.451 MG tablet     . levothyroxine (SYNTHROID, LEVOTHROID) 75 MCG tablet Take 1 tablet (75 mcg total) by mouth daily before breakfast. 90 tablet 1  . meloxicam (MOBIC) 15 MG tablet Take 1 tablet (15 mg total) by mouth daily. 30 tablet 0   No current facility-administered medications on file prior to visit.     Past Medical History:  Diagnosis Date  . Chicken pox   . Thyroid disease     Past Surgical History:  Procedure Laterality Date  . MOHS SURGERY  2017   nose  . WISDOM TOOTH EXTRACTION      Social History   Social History  . Marital status: Single    Spouse name: N/A  . Number of children: 0  . Years of education: 14   Occupational History  . [employer]   Social History Main Topics  . Smoking status: Never Smoker  . Smokeless tobacco: Never Used  . Alcohol use 1.8 oz/week    1 Glasses of wine, 1 Cans of beer, 1 Shots of liquor per week  . Drug use: No  . Sexual activity: Yes    Birth control/ protection: Pill   Other Topics Concern  . None   Social History Narrative   Ms. Hendry lives with her father & grandmother.    Denies abuse and feels safe at home.     Family History  Problem Relation Age of Onset    . Arthritis Father   . Hypertension Father   . Thyroid disease Father   . Diabetes Father   . Depression Mother   . Thyroid disease Sister   . Healthy Maternal Grandmother   . Diabetes Maternal Grandfather   . Hypertension Maternal Grandfather   . COPD Maternal Grandfather   . Hypertension Paternal Grandmother   . Diabetes Paternal Grandfather     Review of Systems  Constitutional: Negative for chills and fever.  Eyes: Negative for visual disturbance.  Musculoskeletal: Positive for back pain (intermittent).       Objective:   Vitals:   01/22/17 1112  BP: 110/78  Pulse: 80  Temp: 97.6 F (36.4 C)   Filed Weights   01/22/17 1112  Weight: 152 lb (68.9 kg)   Body mass index is 24.53 kg/m.  Wt Readings from Last 3 Encounters:  01/22/17 152 lb (68.9 kg)  12/13/15 151 lb (68.5 kg)  11/07/14 136 lb (61.7 kg)     Physical Exam Constitutional: She appears well-developed and well-nourished. No distress.  HENT:  Head: Normocephalic and atraumatic.  Right Ear: External ear normal. Normal ear canal and TM Left Ear: External ear normal.  Normal ear canal and TM Mouth/Throat: Oropharynx is clear  and moist.  Eyes: Conjunctivae and EOM are normal.  Neck: Neck supple. No tracheal deviation present. No thyromegaly present.  No carotid bruit  Cardiovascular: Normal rate, regular rhythm and normal heart sounds.   No murmur heard.  No edema. Pulmonary/Chest: Effort normal and breath sounds normal. No respiratory distress. She has no wheezes. She has no rales.  Breast: deferred to Gyn Abdominal: Soft. She exhibits no distension. There is no tenderness.  Lymphadenopathy: She has no cervical adenopathy.  Skin: Skin is warm and dry. She is not diaphoretic.  Psychiatric: She has a normal mood and affect. Her behavior is normal.       Assessment & Plan:   Physical exam: Screening blood work  ordered Immunizations  Up to date  Gyn   Up to date  Eye exams  Up to date   Exercise - irregular Weight  Normal BMI Skin no concerns - sees derm annually Substance abuse   none  See Problem List for Assessment and Plan of chronic medical problems.

## 2017-01-22 NOTE — Assessment & Plan Note (Signed)
Check tsh  Titrate med dose if needed  

## 2017-01-23 ENCOUNTER — Other Ambulatory Visit (INDEPENDENT_AMBULATORY_CARE_PROVIDER_SITE_OTHER): Payer: 59

## 2017-01-23 DIAGNOSIS — Z0000 Encounter for general adult medical examination without abnormal findings: Secondary | ICD-10-CM | POA: Diagnosis not present

## 2017-01-23 DIAGNOSIS — E038 Other specified hypothyroidism: Secondary | ICD-10-CM

## 2017-01-23 DIAGNOSIS — E063 Autoimmune thyroiditis: Secondary | ICD-10-CM

## 2017-01-23 LAB — CBC WITH DIFFERENTIAL/PLATELET
Basophils Absolute: 0.1 10*3/uL (ref 0.0–0.1)
Basophils Relative: 0.9 % (ref 0.0–3.0)
EOS PCT: 3.2 % (ref 0.0–5.0)
Eosinophils Absolute: 0.2 10*3/uL (ref 0.0–0.7)
HCT: 40.1 % (ref 36.0–46.0)
HEMOGLOBIN: 13.4 g/dL (ref 12.0–15.0)
Lymphocytes Relative: 32.9 % (ref 12.0–46.0)
Lymphs Abs: 2.2 10*3/uL (ref 0.7–4.0)
MCHC: 33.5 g/dL (ref 30.0–36.0)
MCV: 86.5 fl (ref 78.0–100.0)
MONOS PCT: 9.9 % (ref 3.0–12.0)
Monocytes Absolute: 0.7 10*3/uL (ref 0.1–1.0)
Neutro Abs: 3.5 10*3/uL (ref 1.4–7.7)
Neutrophils Relative %: 53.1 % (ref 43.0–77.0)
Platelets: 299 10*3/uL (ref 150.0–400.0)
RBC: 4.64 Mil/uL (ref 3.87–5.11)
RDW: 12.9 % (ref 11.5–15.5)
WBC: 6.6 10*3/uL (ref 4.0–10.5)

## 2017-01-23 LAB — COMPREHENSIVE METABOLIC PANEL
ALBUMIN: 4 g/dL (ref 3.5–5.2)
ALK PHOS: 49 U/L (ref 39–117)
ALT: 12 U/L (ref 0–35)
AST: 18 U/L (ref 0–37)
BUN: 14 mg/dL (ref 6–23)
CALCIUM: 9.2 mg/dL (ref 8.4–10.5)
CO2: 26 mEq/L (ref 19–32)
Chloride: 105 mEq/L (ref 96–112)
Creatinine, Ser: 0.77 mg/dL (ref 0.40–1.20)
GFR: 96.29 mL/min (ref >60.00)
Glucose, Bld: 87 mg/dL (ref 70–99)
POTASSIUM: 3.8 meq/L (ref 3.5–5.1)
Sodium: 139 mEq/L (ref 135–145)
TOTAL PROTEIN: 7.2 g/dL (ref 6.0–8.3)
Total Bilirubin: 0.6 mg/dL (ref 0.2–1.2)

## 2017-01-23 LAB — LIPID PANEL
CHOL/HDL RATIO: 3
Cholesterol: 191 mg/dL (ref 0–200)
HDL: 72.2 mg/dL (ref >39.00)
LDL CALC: 103 mg/dL — AB (ref 0–99)
NonHDL: 118.5
Triglycerides: 80 mg/dL (ref 0.0–149.0)
VLDL: 16 mg/dL (ref 0.0–40.0)

## 2017-01-23 LAB — TSH: TSH: 1.46 u[IU]/mL (ref 0.35–4.50)

## 2017-01-26 MED ORDER — LEVOTHYROXINE SODIUM 75 MCG PO TABS: 75.0000 ug | ORAL_TABLET | Freq: Every day | ORAL | 1 refills | Status: DC

## 2017-01-26 MED FILL — LEVOTHYROXINE 75 MCG TABLET: 75 | 90 days supply | Qty: 90 | Fill #0

## 2017-02-26 DIAGNOSIS — Z85828 Personal history of other malignant neoplasm of skin: Secondary | ICD-10-CM | POA: Diagnosis not present

## 2017-02-26 DIAGNOSIS — L905 Scar conditions and fibrosis of skin: Secondary | ICD-10-CM | POA: Diagnosis not present

## 2017-02-26 DIAGNOSIS — D225 Melanocytic nevi of trunk: Secondary | ICD-10-CM | POA: Diagnosis not present

## 2017-02-26 DIAGNOSIS — L814 Other melanin hyperpigmentation: Secondary | ICD-10-CM | POA: Diagnosis not present

## 2017-03-03 MED FILL — DROSP-EE-LEVOMEF 3-0.02-0.4: 3-0.02-0.45 | 70 days supply | Qty: 84 | Fill #1

## 2017-03-25 ENCOUNTER — Other Ambulatory Visit: Payer: Self-pay

## 2017-03-25 MED ORDER — IBUPROFEN-FAMOTIDINE 800-26.6 MG PO TABS: ORAL_TABLET | ORAL | 11 refills | Status: DC

## 2017-03-31 ENCOUNTER — Other Ambulatory Visit: Payer: Self-pay | Admitting: Family

## 2017-03-31 MED ORDER — IBUPROFEN-FAMOTIDINE 800-26.6 MG PO TABS: ORAL_TABLET | ORAL | 11 refills | Status: DC

## 2017-04-16 ENCOUNTER — Other Ambulatory Visit: Payer: Self-pay | Admitting: *Deleted

## 2017-04-16 MED ORDER — NITROGLYCERIN 0.2 MG/HR TD PT24: MEDICATED_PATCH | TRANSDERMAL | 12 refills | Status: DC

## 2017-04-16 MED FILL — NITROGLYCERIN 0.2 MG/HR PTC: 0.2 | 84 days supply | Qty: 21 | Fill #0

## 2017-04-16 NOTE — Telephone Encounter (Signed)
Per Tamala Julian, send nitroglycerin patches into pharmacy.  Use 1/4 patch daily.

## 2017-04-17 DIAGNOSIS — L309 Dermatitis, unspecified: Secondary | ICD-10-CM | POA: Diagnosis not present

## 2017-04-17 MED FILL — FLUTICASONE PROP 0.05% CRM: 0.05 | 20 days supply | Qty: 60 | Fill #0

## 2017-04-30 MED FILL — LEVOTHYROXINE 75 MCG TABLET: 75 | 90 days supply | Qty: 90 | Fill #1

## 2017-05-18 MED FILL — DROSP-EE-LEVOMEF 3-0.02-0.4: 3-0.02-0.45 | 70 days supply | Qty: 84 | Fill #2

## 2017-06-04 ENCOUNTER — Ambulatory Visit (INDEPENDENT_AMBULATORY_CARE_PROVIDER_SITE_OTHER): Payer: 59 | Admitting: Family Medicine

## 2017-06-04 ENCOUNTER — Encounter: Payer: Self-pay | Admitting: Family Medicine

## 2017-06-04 ENCOUNTER — Ambulatory Visit: Payer: Self-pay

## 2017-06-04 VITALS — BP 110/78

## 2017-06-04 DIAGNOSIS — M79672 Pain in left foot: Secondary | ICD-10-CM

## 2017-06-04 DIAGNOSIS — S8011XD Contusion of right lower leg, subsequent encounter: Secondary | ICD-10-CM | POA: Diagnosis not present

## 2017-06-04 NOTE — Progress Notes (Signed)
[name] [address] [address], [place] [address] Phone: [phone] Subjective:     CC: Right leg pain f/u  [id number]  Patricia [name] is a 26 y.o. female coming in with complaint of right leg pain. Patient was found to have a large contusion. Patient was also having still some edema and possible muscle injury. Patient given the nitroglycerin patches. Patient has been doing very well. Very minimal pain. Feels like it is making significant improvement. Scarring is even better.     Past Medical History:  Diagnosis Date  . Chicken pox   . Thyroid disease    Past Surgical History:  Procedure Laterality Date  . MOHS SURGERY  2017   nose  . WISDOM TOOTH EXTRACTION     Social History   Social History  . Marital status: Single    Spouse name: N/A  . Number of children: 0  . Years of education: 14   Occupational History  . [employer]   Social History Main Topics  . Smoking status: Never Smoker  . Smokeless tobacco: Never Used  . Alcohol use 1.8 oz/week    1 Glasses of wine, 1 Cans of beer, 1 Shots of liquor per week  . Drug use: No  . Sexual activity: Yes    Birth control/ protection: Pill   Other Topics Concern  . Not on file   Social History Narrative   Ms. Carrithers lives with her father & grandmother.    Denies abuse and feels safe at home.    Allergies  Allergen Reactions  . Amoxicillin Nausea And Vomiting   Family History  Problem Relation Age of Onset  . Arthritis Father   . Hypertension Father   . Thyroid disease Father   . Diabetes Father   . Depression Mother   . Thyroid disease Sister   . Healthy Maternal Grandmother   . Diabetes Maternal Grandfather   . Hypertension Maternal Grandfather   . COPD Maternal Grandfather   . Hypertension Paternal Grandmother   . Diabetes Paternal Grandfather     Past medical history, social, surgical and family history all reviewed in electronic medical  record.  No pertanent information unless stated regarding to the chief complaint.   Review of Systems: No headache, visual changes, nausea, vomiting, diarrhea, constipation, dizziness, abdominal pain, skin rash, fevers, chills, night sweats, weight loss, swollen lymph nodes, body aches, joint swelling, muscle aches, chest pain, shortness of breath, mood changes.    Objective  Blood pressure 110/78.  Systems examined below as of 06/04/17 General: NAD A&O x3 mood, affect normal  HEENT: Pupils equal, extraocular movements intact no nystagmus Respiratory: not short of breath at rest or with speaking Cardiovascular: No lower extremity edema, non tender Skin: Warm dry intact with no signs of infection or rash on extremities or on axial skeleton. Abdomen: Soft nontender, no masses Neuro: Cranial nerves  intact, neurovascularly intact in all extremities with 2+ DTRs and 2+ pulses. Lymph: No lymphadenopathy appreciated today  Gait normal with good balance and coordination.  MSK: Non tender with full range of motion and good stability and symmetric strength and tone of shoulders, elbows, wrist,  knee hips and ankles bilaterally.   Anterior leg and does have significant decrease in swelling. Patient abrasion is healing very well. Very minimally tender in the area.  Limited muscular skeletal ultrasound was performed and interpreted by Lyndal Pulley  Limited ultrasound of the area shows the patient does  still has some very mild edema. Patient does have an injury to the subdural layer still noted. With mild increase in Doppler flow. Patient's muscle and bone are completely healed with no significant difficulty  Impression: Very mild edema still of the interior laying otherwise unremarkable        Impression and Recommendations:     This case required medical decision making of moderate complexity.      Note: This dictation was prepared with Dragon dictation along with smaller phrase  technology. Any transcriptional errors that result from this process are unintentional.

## 2017-06-04 NOTE — Assessment & Plan Note (Signed)
Improved.  Start to decrease nitro patches. Discussed with patient about the possible HEP, Able to increase activity  F/u again in 4 weeks if not completely resolved.

## 2017-07-02 DIAGNOSIS — H52223 Regular astigmatism, bilateral: Secondary | ICD-10-CM | POA: Diagnosis not present

## 2017-07-02 DIAGNOSIS — H5213 Myopia, bilateral: Secondary | ICD-10-CM | POA: Diagnosis not present

## 2017-07-29 MED FILL — DROSP-EE-LEVOMEF 3-0.02-0.4: 3-0.02-0.45 | 70 days supply | Qty: 84 | Fill #3

## 2017-07-30 ENCOUNTER — Other Ambulatory Visit: Payer: Self-pay

## 2017-07-30 DIAGNOSIS — E038 Other specified hypothyroidism: Secondary | ICD-10-CM

## 2017-07-30 DIAGNOSIS — E063 Autoimmune thyroiditis: Principal | ICD-10-CM

## 2017-07-30 MED ORDER — LEVOTHYROXINE SODIUM 75 MCG PO TABS: 75.0000 ug | ORAL_TABLET | Freq: Every day | ORAL | 1 refills | Status: DC

## 2017-07-30 MED FILL — LEVOTHYROXINE 75 MCG TABLET: 75 | 90 days supply | Qty: 90 | Fill #0

## 2017-09-03 ENCOUNTER — Telehealth: Payer: Self-pay | Admitting: *Deleted

## 2017-09-03 NOTE — Telephone Encounter (Signed)
Pt inform received flu shot on 08/11/17 need to be documented in chart...Patricia [name]

## 2017-09-15 ENCOUNTER — Other Ambulatory Visit: Payer: Self-pay | Admitting: Internal Medicine

## 2017-09-15 DIAGNOSIS — R2231 Localized swelling, mass and lump, right upper limb: Secondary | ICD-10-CM

## 2017-09-15 DIAGNOSIS — E041 Nontoxic single thyroid nodule: Secondary | ICD-10-CM

## 2017-10-01 ENCOUNTER — Ambulatory Visit
Admission: RE | Admit: 2017-10-01 | Discharge: 2017-10-01 | Disposition: A | Discharge status: Home / Self Care | Payer: 59 | Source: Ambulatory Visit | Attending: Internal Medicine | Admitting: Internal Medicine

## 2017-10-01 ENCOUNTER — Encounter: Payer: Self-pay | Admitting: Internal Medicine

## 2017-10-01 DIAGNOSIS — E041 Nontoxic single thyroid nodule: Secondary | ICD-10-CM

## 2017-10-01 DIAGNOSIS — D1720 Benign lipomatous neoplasm of skin and subcutaneous tissue of unspecified limb: Secondary | ICD-10-CM | POA: Insufficient documentation

## 2017-10-01 DIAGNOSIS — R2231 Localized swelling, mass and lump, right upper limb: Secondary | ICD-10-CM

## 2017-10-01 DIAGNOSIS — N6489 Other specified disorders of breast: Secondary | ICD-10-CM | POA: Diagnosis not present

## 2017-10-13 MED FILL — DROSP-EE-LEVOMEF 3-0.02-0.4: 3-0.02-0.45 | 70 days supply | Qty: 84 | Fill #4

## 2017-10-21 ENCOUNTER — Encounter: Payer: Self-pay | Admitting: Family Medicine

## 2017-10-21 ENCOUNTER — Ambulatory Visit (INDEPENDENT_AMBULATORY_CARE_PROVIDER_SITE_OTHER): Payer: 59 | Admitting: Family Medicine

## 2017-10-21 DIAGNOSIS — M940 Chondrocostal junction syndrome [Tietze]: Secondary | ICD-10-CM | POA: Diagnosis not present

## 2017-10-21 DIAGNOSIS — J329 Chronic sinusitis, unspecified: Secondary | ICD-10-CM | POA: Insufficient documentation

## 2017-10-21 DIAGNOSIS — J0100 Acute maxillary sinusitis, unspecified: Secondary | ICD-10-CM | POA: Diagnosis not present

## 2017-10-21 DIAGNOSIS — M999 Biomechanical lesion, unspecified: Secondary | ICD-10-CM

## 2017-10-21 DIAGNOSIS — J019 Acute sinusitis, unspecified: Secondary | ICD-10-CM | POA: Insufficient documentation

## 2017-10-21 MED ORDER — AMOXICILLIN-POT CLAVULANATE 875-125 MG PO TABS: 1.0000 | ORAL_TABLET | Freq: Two times a day (BID) | ORAL | 0 refills | Status: DC

## 2017-10-21 MED FILL — AMOX TR-K CLV 875-125 MG TA: 875-125 | 10 days supply | Qty: 20 | Fill #0

## 2017-10-21 NOTE — Progress Notes (Signed)
[name] [address] [place], [place] [address] Phone: [phone] Subjective:    I'm seeing this patient by the request  of:    CC: Neck and back pain  LFY:[id number]  Patricia [name] is a 26 y.o. female coming in with complaint of neck and back pain.  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  Has noticed is gotten worse and she has been feeling sick.  Has had a head cold for greater than 2 weeks at this time.  Feels like it is worsening.  Has had a low-grade fever at home.  Patient notes states that the pain between the shoulder blades seems to be fairly severe.  More on the left than the right side.  Any type of movement seems to make it worse.  Denies any injury.       Past Medical History:  Diagnosis Date  . Chicken pox   . Thyroid disease    Past Surgical History:  Procedure Laterality Date  . MOHS SURGERY  2017   nose  . WISDOM TOOTH EXTRACTION     Social History   Socioeconomic History  . Marital status: Single    Spouse name: Not on file  . Number of children: 0  . Years of education: 96  . Highest education level: Not on file  Social Needs  . Financial resource strain: Not on file  . Food insecurity - worry: Not on file  . Food insecurity - inability: Not on file  . Transportation needs - medical: Not on file  . Transportation needs - non-medical: Not on file  Occupational History  . Occupation: CMA    Employer: [employer]  Tobacco Use  . Smoking status: Never Smoker  . Smokeless tobacco: Never Used  Substance and Sexual Activity  . Alcohol use: Yes    Alcohol/week: 1.8 oz    Types: 1 Glasses of wine, 1 Cans of beer, 1 Shots of liquor per week  . Drug use: No  . Sexual activity: Yes    Birth control/protection: Pill  Other Topics Concern  . Not on file  Social History Narrative   Patricia [name] lives with her father & grandmother.    Denies abuse and feels safe at home.     Allergies  Allergen Reactions  . Amoxicillin Nausea And Vomiting   Family History  Problem Relation Age of Onset  . Arthritis Father   . Hypertension Father   . Thyroid disease Father   . Diabetes Father   . Depression Mother   . Thyroid disease Sister   . Healthy Maternal Grandmother   . Diabetes Maternal Grandfather   . Hypertension Maternal Grandfather   . COPD Maternal Grandfather   . Hypertension Paternal Grandmother   . Diabetes Paternal Grandfather      Past medical history, social, surgical and family history all reviewed in electronic medical record.  No pertanent information unless stated regarding to the chief complaint.   Review of Systems:Review of systems updated and as accurate as of 10/21/17  No headache, visual changes, nausea, vomiting, diarrhea, constipation, dizziness, abdominal pain, skin rash, fevers, chills, night sweats, weight loss, swollen lymph nodes, body aches, joint swelling, muscle aches, chest pain, shortness of breath, mood changes.   Objective  There were no vitals taken for this visit. Systems examined below as of 10/21/17   General: No apparent distress alert and oriented x3 mood and affect normal, dressed appropriately.  HEENT: Pupils equal, extraocular movements intact she does have significant postnasal drip.  No significant tenderness to palpation over the maxillary sinuses Respiratory: Patient's speak in full sentences and does not appear short of breath  Cardiovascular: No lower extremity edema, non tender, no erythema  Skin: Warm dry intact with no signs of infection or rash on extremities or on axial skeleton.  Abdomen: Soft nontender  Neuro: Cranial nerves II through XII are intact, neurovascularly intact in all extremities with 2+ DTRs and 2+ pulses.  Lymph: No lymphadenopathy of posterior or anterior cervical chain or axillae bilaterally.  Gait normal with good balance and coordination.  MSK:  Non tender with full range of  motion and good stability and symmetric strength and tone of shoulders, elbows, wrist, hip, knee and ankles bilaterally.  Neck: Inspection mild loss of lordosis. No palpable stepoffs. Negative Spurling's maneuver. Rotation lacking last 5 degrees of flexion Grip strength and sensation normal in bilateral hands Strength good C4 to T1 distribution No sensory change to C4 to T1 Negative Hoffman sign bilaterally Reflexes normal Significant tightness of the left trapezius muscle.  Osteopathic findings C2 flexed rotated and side bent right C4 flexed rotated and side bent left C7 flexed rotated and side bent left T7 extended rotated and side bent left with inhaled rib L2 flexed rotated and side bent right    Impression and Recommendations:     This case required medical decision making of moderate complexity.      Note: This dictation was prepared with Dragon dictation along with smaller phrase technology. Any transcriptional errors that result from this process are unintentional.

## 2017-10-21 NOTE — Assessment & Plan Note (Signed)
Continues to give patient some discomfort and pain.  We discussed posture, ergonomics, patient given scapular exercises previously and he did not discuss taking it on a regular basis for the next 3 days.  Patient will follow up with me again in 4-6 weeks

## 2017-10-21 NOTE — Assessment & Plan Note (Signed)
Greater than 2 weeks of discomfort and production, patient is having worsening headache.  Patient will be traveling for the holidays.  Patient will take Augmentin.  Discussed taking with food.  Follow-up again 10 days if not resolved

## 2017-10-21 NOTE — Assessment & Plan Note (Signed)
Decision today to treat with OMT was based on Physical Exam  After verbal consent patient was treated with HVLA, ME, FPR techniques in cervical, thoracic, rib lumbar areas  Patient tolerated the procedure well with improvement in symptoms  Patient given exercises, stretches and lifestyle modifications  See medications in patient instructions if given  Patient will follow up in 4 weeks

## 2017-11-02 MED FILL — LEVOTHYROXINE 75 MCG TABLET: 75 | 90 days supply | Qty: 90 | Fill #1

## 2017-12-17 DIAGNOSIS — Z124 Encounter for screening for malignant neoplasm of cervix: Secondary | ICD-10-CM | POA: Diagnosis not present

## 2017-12-17 DIAGNOSIS — Z1151 Encounter for screening for human papillomavirus (HPV): Secondary | ICD-10-CM | POA: Diagnosis not present

## 2017-12-17 DIAGNOSIS — Z3041 Encounter for surveillance of contraceptive pills: Secondary | ICD-10-CM | POA: Diagnosis not present

## 2017-12-17 DIAGNOSIS — Z01419 Encounter for gynecological examination (general) (routine) without abnormal findings: Secondary | ICD-10-CM | POA: Diagnosis not present

## 2017-12-17 DIAGNOSIS — Z6823 Body mass index (BMI) 23.0-23.9, adult: Secondary | ICD-10-CM | POA: Diagnosis not present

## 2017-12-17 DIAGNOSIS — D1721 Benign lipomatous neoplasm of skin and subcutaneous tissue of right arm: Secondary | ICD-10-CM | POA: Diagnosis not present

## 2017-12-17 DIAGNOSIS — Z130 Encounter for screening for diseases of the blood and blood-forming organs and certain disorders involving the immune mechanism: Secondary | ICD-10-CM | POA: Diagnosis not present

## 2017-12-17 DIAGNOSIS — Z1389 Encounter for screening for other disorder: Secondary | ICD-10-CM | POA: Diagnosis not present

## 2017-12-17 MED FILL — DROSPIREN-ETH ESTRAD-LEVOME: 3-0.02-0.45 | 70 days supply | Qty: 84 | Fill #0

## 2017-12-18 DIAGNOSIS — Z124 Encounter for screening for malignant neoplasm of cervix: Secondary | ICD-10-CM | POA: Diagnosis not present

## 2018-01-28 ENCOUNTER — Encounter: Payer: 59 | Admitting: Internal Medicine

## 2018-02-03 NOTE — Patient Instructions (Addendum)
All other Health Maintenance issues reviewed.   All recommended immunizations and age-appropriate screenings are up-to-date or discussed.  No immunizations administered today.   Medications reviewed and updated.  Changes include increase thyroid dose by 2 extra doses a week.  Please followup in one year   Health Maintenance, Female Adopting a healthy lifestyle and getting preventive care can go a long way to promote health and wellness. Talk with your health care provider about what schedule of regular examinations is right for you. This is a good chance for you to check in with your provider about disease prevention and staying healthy. In between checkups, there are plenty of things you can do on your own. Experts have done a lot of research about which lifestyle changes and preventive measures are most likely to keep you healthy. Ask your health care provider for more information. Weight and diet Eat a healthy diet  Be sure to include plenty of vegetables, fruits, low-fat dairy products, and lean protein.  Do not eat a lot of foods high in solid fats, added sugars, or salt.  Get regular exercise. This is one of the most important things you can do for your health. ? Most adults should exercise for at least 150 minutes each week. The exercise should increase your heart rate and make you sweat (moderate-intensity exercise). ? Most adults should also do strengthening exercises at least twice a week. This is in addition to the moderate-intensity exercise.  Maintain a healthy weight  Body mass index (BMI) is a measurement that can be used to identify possible weight problems. It estimates body fat based on height and weight. Your health care provider can help determine your BMI and help you achieve or maintain a healthy weight.  For females 39 years of age and older: ? A BMI below 18.5 is considered underweight. ? A BMI of 18.5 to 24.9 is normal. ? A BMI of 25 to 29.9 is considered  overweight. ? A BMI of 30 and above is considered obese.  Watch levels of cholesterol and blood lipids  You should start having your blood tested for lipids and cholesterol at 27 years of age, then have this test every 5 years.  You may need to have your cholesterol levels checked more often if: ? Your lipid or cholesterol levels are high. ? You are older than 27 years of age. ? You are at high risk for heart disease.  Cancer screening Lung Cancer  Lung cancer screening is recommended for adults 57-51 years old who are at high risk for lung cancer because of a history of smoking.  A yearly low-dose CT scan of the lungs is recommended for people who: ? Currently smoke. ? Have quit within the past 15 years. ? Have at least a 30-pack-year history of smoking. A pack year is smoking an average of one pack of cigarettes a day for 1 year.  Yearly screening should continue until it has been 15 years since you quit.  Yearly screening should stop if you develop a health problem that would prevent you from having lung cancer treatment.  Breast Cancer  Practice breast self-awareness. This means understanding how your breasts normally appear and feel.  It also means doing regular breast self-exams. Let your health care provider know about any changes, no matter how small.  If you are in your 20s or 30s, you should have a clinical breast exam (CBE) by a health care provider every 1-3 years as part of a regular  health exam.  If you are 40 or older, have a CBE every year. Also consider having a breast X-ray (mammogram) every year.  If you have a family history of breast cancer, talk to your health care provider about genetic screening.  If you are at high risk for breast cancer, talk to your health care provider about having an MRI and a mammogram every year.  Breast cancer gene (BRCA) assessment is recommended for women who have family members with BRCA-related cancers. BRCA-related cancers  include: ? Breast. ? Ovarian. ? Tubal. ? Peritoneal cancers.  Results of the assessment will determine the need for genetic counseling and BRCA1 and BRCA2 testing.  Cervical Cancer Your health care provider may recommend that you be screened regularly for cancer of the pelvic organs (ovaries, uterus, and vagina). This screening involves a pelvic examination, including checking for microscopic changes to the surface of your cervix (Pap test). You may be encouraged to have this screening done every 3 years, beginning at age 21.  For women ages 30-65, health care providers may recommend pelvic exams and Pap testing every 3 years, or they may recommend the Pap and pelvic exam, combined with testing for human papilloma virus (HPV), every 5 years. Some types of HPV increase your risk of cervical cancer. Testing for HPV may also be done on women of any age with unclear Pap test results.  Other health care providers may not recommend any screening for nonpregnant women who are considered low risk for pelvic cancer and who do not have symptoms. Ask your health care provider if a screening pelvic exam is right for you.  If you have had past treatment for cervical cancer or a condition that could lead to cancer, you need Pap tests and screening for cancer for at least 20 years after your treatment. If Pap tests have been discontinued, your risk factors (such as having a new sexual partner) need to be reassessed to determine if screening should resume. Some women have medical problems that increase the chance of getting cervical cancer. In these cases, your health care provider may recommend more frequent screening and Pap tests.  Colorectal Cancer  This type of cancer can be detected and often prevented.  Routine colorectal cancer screening usually begins at 27 years of age and continues through 27 years of age.  Your health care provider may recommend screening at an earlier age if you have risk factors  for colon cancer.  Your health care provider may also recommend using home test kits to check for hidden blood in the stool.  A small camera at the end of a tube can be used to examine your colon directly (sigmoidoscopy or colonoscopy). This is done to check for the earliest forms of colorectal cancer.  Routine screening usually begins at age 50.  Direct examination of the colon should be repeated every 5-10 years through 27 years of age. However, you may need to be screened more often if early forms of precancerous polyps or small growths are found.  Skin Cancer  Check your skin from head to toe regularly.  Tell your health care provider about any new moles or changes in moles, especially if there is a change in a mole's shape or color.  Also tell your health care provider if you have a mole that is larger than the size of a pencil eraser.  Always use sunscreen. Apply sunscreen liberally and repeatedly throughout the day.  Protect yourself by wearing long sleeves, pants, a   wide-brimmed hat, and sunglasses whenever you are outside.  Heart disease, diabetes, and high blood pressure  High blood pressure causes heart disease and increases the risk of stroke. High blood pressure is more likely to develop in: ? People who have blood pressure in the high end of the normal range (130-139/85-89 mm Hg). ? People who are overweight or obese. ? People who are African American.  If you are 54-30 years of age, have your blood pressure checked every 3-5 years. If you are 3 years of age or older, have your blood pressure checked every year. You should have your blood pressure measured twice-once when you are at a hospital or clinic, and once when you are not at a hospital or clinic. Record the average of the two measurements. To check your blood pressure when you are not at a hospital or clinic, you can use: ? An automated blood pressure machine at a pharmacy. ? A home blood pressure monitor.  If  you are between 55 years and 21 years old, ask your health care provider if you should take aspirin to prevent strokes.  Have regular diabetes screenings. This involves taking a blood sample to check your fasting blood sugar level. ? If you are at a normal weight and have a low risk for diabetes, have this test once every three years after 27 years of age. ? If you are overweight and have a high risk for diabetes, consider being tested at a younger age or more often. Preventing infection Hepatitis B  If you have a higher risk for hepatitis B, you should be screened for this virus. You are considered at high risk for hepatitis B if: ? You were born in a country where hepatitis B is common. Ask your health care provider which countries are considered high risk. ? Your parents were born in a high-risk country, and you have not been immunized against hepatitis B (hepatitis B vaccine). ? You have HIV or AIDS. ? You use needles to inject street drugs. ? You live with someone who has hepatitis B. ? You have had sex with someone who has hepatitis B. ? You get hemodialysis treatment. ? You take certain medicines for conditions, including cancer, organ transplantation, and autoimmune conditions.  Hepatitis C  Blood testing is recommended for: ? Everyone born from [birth year] through 1965. ? Anyone with known risk factors for hepatitis C.  Sexually transmitted infections (STIs)  You should be screened for sexually transmitted infections (STIs) including gonorrhea and chlamydia if: ? You are sexually active and are younger than 27 years of age. ? You are older than 27 years of age and your health care provider tells you that you are at risk for this type of infection. ? Your sexual activity has changed since you were last screened and you are at an increased risk for chlamydia or gonorrhea. Ask your health care provider if you are at risk.  If you do not have HIV, but are at risk, it may be recommended  that you take a prescription medicine daily to prevent HIV infection. This is called pre-exposure prophylaxis (PrEP). You are considered at risk if: ? You are sexually active and do not regularly use condoms or know the HIV status of your partner(s). ? You take drugs by injection. ? You are sexually active with a partner who has HIV.  Talk with your health care provider about whether you are at high risk of being infected with HIV. If you choose to  begin PrEP, you should first be tested for HIV. You should then be tested every 3 months for as long as you are taking PrEP. Pregnancy  If you are premenopausal and you may become pregnant, ask your health care provider about preconception counseling.  If you may become pregnant, take 400 to 800 micrograms (mcg) of folic acid every day.  If you want to prevent pregnancy, talk to your health care provider about birth control (contraception). Osteoporosis and menopause  Osteoporosis is a disease in which the bones lose minerals and strength with aging. This can result in serious bone fractures. Your risk for osteoporosis can be identified using a bone density scan.  If you are 65 years of age or older, or if you are at risk for osteoporosis and fractures, ask your health care provider if you should be screened.  Ask your health care provider whether you should take a calcium or vitamin D supplement to lower your risk for osteoporosis.  Menopause may have certain physical symptoms and risks.  Hormone replacement therapy may reduce some of these symptoms and risks. Talk to your health care provider about whether hormone replacement therapy is right for you. Follow these instructions at home:  Schedule regular health, dental, and eye exams.  Stay current with your immunizations.  Do not use any tobacco products including cigarettes, chewing tobacco, or electronic cigarettes.  If you are pregnant, do not drink alcohol.  If you are  breastfeeding, limit how much and how often you drink alcohol.  Limit alcohol intake to no more than 1 drink per day for nonpregnant women. One drink equals 12 ounces of beer, 5 ounces of wine, or 1 ounces of hard liquor.  Do not use street drugs.  Do not share needles.  Ask your health care provider for help if you need support or information about quitting drugs.  Tell your health care provider if you often feel depressed.  Tell your health care provider if you have ever been abused or do not feel safe at home. This information is not intended to replace advice given to you by your health care provider. Make sure you discuss any questions you have with your health care provider. Document Released: 05/05/2011 Document Revised: 03/27/2016 Document Reviewed: 07/24/2015 Elsevier Interactive Patient Education  2018 Elsevier Inc[employer]  

## 2018-02-03 NOTE — Progress Notes (Signed)
Subjective:    Patient ID: Patricia [name], female    DOB: 06-Dec-1990, 27 y.o.   MRN: [id number]  HPI She is here for a physical exam.   She denies any major changes in her health since she was here last.  Regular she has no concerns  Medications and allergies reviewed with patient and updated if appropriate.  Patient Active Problem List   Diagnosis Date Noted  . Nonallopathic lesion of cervical region 10/21/2017  . Lipoma of axilla, right 10/01/2017  . Thyroid nodule 10/01/2017  . Hx of skin cancer, basal cell 01/22/2017  . Hypothyroidism 01/22/2017  . 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 Nonallopathic lesion of rib cage 12/31/2016  . Nonallopathic lesion of thoracic region 12/31/2016  . Contusion of leg, right 05/19/2016  . Back pain, chronic 10/31/2013    Current Outpatient Medications on File Prior to Visit  Medication Sig Dispense Refill  . BEYAZ 3-0.02-0.451 MG tablet     . meloxicam (MOBIC) 15 MG tablet Take 1 tablet (15 mg total) by mouth daily. 30 tablet 0   No current facility-administered medications on file prior to visit.     Past Medical History:  Diagnosis Date  . Chicken pox   . Thyroid disease     Past Surgical History:  Procedure Laterality Date  . MOHS SURGERY  2017   nose  . WISDOM TOOTH EXTRACTION      Social History   Socioeconomic History  . Marital status: Single    Spouse name: Not on file  . Number of children: 0  . Years of education: 17  . Highest education level: Not on file  Occupational History  . Occupation: CMA    Employer: [employer]  . Financial resource strain: Not on file  . Food insecurity:    Worry: Not on file    Inability: Not on file  . Transportation needs:    Medical: Not on file    Non-medical: Not on file  Tobacco Use  . Smoking status: Never Smoker  . Smokeless tobacco: Never Used  Substance and Sexual Activity  . Alcohol use: Yes    Alcohol/week: 1.8 oz    Types: 1  Glasses of wine, 1 Cans of beer, 1 Shots of liquor per week  . Drug use: No  . Sexual activity: Yes    Birth control/protection: Pill  Lifestyle  . Physical activity:    Days per week: Not on file    Minutes per session: Not on file  . Stress: Not on file  Relationships  . Social connections:    Talks on phone: Not on file    Gets together: Not on file    Attends religious service: Not on file    Active member of club or organization: Not on file    Attends meetings of clubs or organizations: Not on file    Relationship status: Not on file  Other Topics Concern  . Not on file  Social History Narrative   Patricia [name] lives with her father & grandmother.    Denies abuse and feels safe at home.     Family History  Problem Relation Age of Onset  . Arthritis Father   . Hypertension Father   . Thyroid disease Father   . Diabetes Father   . Depression Mother   . Thyroid disease Sister   . Healthy Maternal Grandmother   . Diabetes Maternal Grandfather   . Hypertension Maternal Grandfather   .  COPD Maternal Grandfather   . Hypertension Paternal Grandmother   . Diabetes Paternal Grandfather     Review of Systems  Constitutional: Negative for chills and fever.  Eyes: Positive for visual disturbance.  Respiratory: Negative for cough, shortness of breath and wheezing.   Cardiovascular: Negative for chest pain, palpitations and leg swelling.  Gastrointestinal: Negative for blood in stool, constipation, diarrhea and nausea.  Genitourinary: Negative for dysuria and hematuria.  Musculoskeletal: Positive for arthralgias.  Skin: Negative for color change and rash.  Neurological: Negative for light-headedness and headaches.  Psychiatric/Behavioral: Negative for dysphoric mood. The patient is not nervous/anxious.        Objective:   Vitals:   02/04/18 1146  BP: 118/80  Pulse: 68  Resp: 16  Temp: 98.1 F (36.7 C)   Filed Weights   02/04/18 1146  Weight: 151 lb (68.5 kg)    Body mass index is 24.37 kg/m.  Wt Readings from Last 3 Encounters:  02/04/18 151 lb (68.5 kg)  01/22/17 152 lb (68.9 kg)  12/13/15 151 lb (68.5 kg)     Physical Exam Constitutional: She appears well-developed and well-nourished. No distress.  HENT:  Head: Normocephalic and atraumatic.  Right Ear: External ear normal. Normal ear canal and TM Left Ear: External ear normal.  Normal ear canal and TM Mouth/Throat: Oropharynx is clear and moist.  Eyes: Conjunctivae and EOM are normal.  Neck: Neck supple. No tracheal deviation present. No thyromegaly present.  No carotid bruit  Cardiovascular: Normal rate, regular rhythm and normal heart sounds.   No murmur heard.  No edema. Pulmonary/Chest: Effort normal and breath sounds normal. No respiratory distress. She has no wheezes. She has no rales.  Breast: deferred to Gyn Abdominal: Soft. She exhibits no distension. There is no tenderness.  Lymphadenopathy: She has no cervical adenopathy.  Skin: Skin is warm and dry. She is not diaphoretic.  Psychiatric: She has a normal mood and affect. Her behavior is normal.        Assessment & Plan:   Physical exam: Screening blood work earlier today-reviewed Immunizations up-to-date Gyn  Up to date  Eye exams  Up to date  Exercise  regular Weight  Working on weight loss Skin  No concerns - sees derm regularly Substance abuse none  See Problem List for Assessment and Plan of chronic medical problems.  Follow-up annually

## 2018-02-04 ENCOUNTER — Other Ambulatory Visit (INDEPENDENT_AMBULATORY_CARE_PROVIDER_SITE_OTHER): Payer: 59

## 2018-02-04 ENCOUNTER — Encounter: Payer: Self-pay | Admitting: Internal Medicine

## 2018-02-04 ENCOUNTER — Ambulatory Visit (INDEPENDENT_AMBULATORY_CARE_PROVIDER_SITE_OTHER): Payer: 59 | Admitting: Internal Medicine

## 2018-02-04 VITALS — BP 118/80 | HR 68 | Temp 98.1°F | Resp 16 | Ht 66.0 in | Wt 151.0 lb

## 2018-02-04 DIAGNOSIS — E038 Other specified hypothyroidism: Secondary | ICD-10-CM | POA: Diagnosis not present

## 2018-02-04 DIAGNOSIS — E039 Hypothyroidism, unspecified: Secondary | ICD-10-CM

## 2018-02-04 DIAGNOSIS — Z0000 Encounter for general adult medical examination without abnormal findings: Secondary | ICD-10-CM

## 2018-02-04 DIAGNOSIS — E063 Autoimmune thyroiditis: Secondary | ICD-10-CM

## 2018-02-04 LAB — CBC WITH DIFFERENTIAL/PLATELET
BASOS PCT: 0.7 % (ref 0.0–3.0)
Basophils Absolute: 0 10*3/uL (ref 0.0–0.1)
Eosinophils Absolute: 0.2 10*3/uL (ref 0.0–0.7)
Eosinophils Relative: 3.1 % (ref 0.0–5.0)
HEMATOCRIT: 39.1 % (ref 36.0–46.0)
Hemoglobin: 13.3 g/dL (ref 12.0–15.0)
LYMPHS PCT: 31.2 % (ref 12.0–46.0)
Lymphs Abs: 1.8 10*3/uL (ref 0.7–4.0)
MCHC: 33.9 g/dL (ref 30.0–36.0)
MCV: 86.1 fl (ref 78.0–100.0)
MONOS PCT: 9 % (ref 3.0–12.0)
Monocytes Absolute: 0.5 10*3/uL (ref 0.1–1.0)
NEUTROS ABS: 3.2 10*3/uL (ref 1.4–7.7)
Neutrophils Relative %: 56 % (ref 43.0–77.0)
PLATELETS: 260 10*3/uL (ref 150.0–400.0)
RBC: 4.55 Mil/uL (ref 3.87–5.11)
RDW: 12.8 % (ref 11.5–15.5)
WBC: 5.7 10*3/uL (ref 4.0–10.5)

## 2018-02-04 LAB — COMPREHENSIVE METABOLIC PANEL
ALT: 10 U/L (ref 0–35)
AST: 16 U/L (ref 0–37)
Albumin: 3.8 g/dL (ref 3.5–5.2)
Alkaline Phosphatase: 46 U/L (ref 39–117)
BUN: 15 mg/dL (ref 6–23)
CALCIUM: 9 mg/dL (ref 8.4–10.5)
CO2: 26 meq/L (ref 19–32)
Chloride: 104 mEq/L (ref 96–112)
Creatinine, Ser: 0.77 mg/dL (ref 0.40–1.20)
GFR: 95.54 mL/min (ref >60.00)
Glucose, Bld: 82 mg/dL (ref 70–99)
Potassium: 3.7 mEq/L (ref 3.5–5.1)
Sodium: 138 mEq/L (ref 135–145)
Total Bilirubin: 0.5 mg/dL (ref 0.2–1.2)
Total Protein: 7 g/dL (ref 6.0–8.3)

## 2018-02-04 LAB — LIPID PANEL
CHOL/HDL RATIO: 2
Cholesterol: 174 mg/dL (ref 0–200)
HDL: 71.8 mg/dL (ref >39.00)
LDL Cholesterol: 83 mg/dL (ref 0–99)
NonHDL: 102.67
TRIGLYCERIDES: 100 mg/dL (ref 0.0–149.0)
VLDL: 20 mg/dL (ref 0.0–40.0)

## 2018-02-04 LAB — TSH: TSH: 2.2 u[IU]/mL (ref 0.35–4.50)

## 2018-02-04 MED ORDER — LEVOTHYROXINE SODIUM 75 MCG PO TABS: 75.0000 ug | ORAL_TABLET | Freq: Every day | ORAL | 3 refills | Status: DC

## 2018-02-04 MED FILL — LEVOTHYROXINE 75 MCG TABLET: 75 | 84 days supply | Qty: 108 | Fill #0

## 2018-02-04 NOTE — Assessment & Plan Note (Signed)
Thyroid function normal, but just higher than ideal Taking 75 mcg daily-will take 2 extra pills during the week

## 2018-02-15 ENCOUNTER — Ambulatory Visit (INDEPENDENT_AMBULATORY_CARE_PROVIDER_SITE_OTHER): Payer: 59 | Admitting: Family Medicine

## 2018-02-15 ENCOUNTER — Encounter: Payer: Self-pay | Admitting: Family Medicine

## 2018-02-15 DIAGNOSIS — M999 Biomechanical lesion, unspecified: Secondary | ICD-10-CM | POA: Diagnosis not present

## 2018-02-15 DIAGNOSIS — M545 Low back pain: Secondary | ICD-10-CM | POA: Diagnosis not present

## 2018-02-15 DIAGNOSIS — G8929 Other chronic pain: Secondary | ICD-10-CM | POA: Diagnosis not present

## 2018-02-15 MED ORDER — METHYLPREDNISOLONE ACETATE 80 MG/ML IJ SUSP
80.0000 mg | Freq: Once | INTRAMUSCULAR | Status: AC
  Administered 2018-02-15: 80 mg via INTRAMUSCULAR

## 2018-02-15 MED ORDER — KETOROLAC TROMETHAMINE 60 MG/2ML IM SOLN
60.0000 mg | Freq: Once | INTRAMUSCULAR | Status: AC
  Administered 2018-02-15: 60 mg via INTRAMUSCULAR

## 2018-02-15 NOTE — Assessment & Plan Note (Signed)
Decision today to treat with OMT was based on Physical Exam  After verbal consent patient was treated with HVLA, ME, FPR techniques in  thoracic, lumbar and sacral areas  Patient tolerated the procedure well with improvement in symptoms  Patient given exercises, stretches and lifestyle modifications  See medications in patient instructions if given  Patient will follow up in 3 weeks 

## 2018-02-15 NOTE — Assessment & Plan Note (Signed)
Morbid exacerbation.  Discussed icing regimen and home exercises.  Patient given 2 injections today that I hope will be beneficial.  Patient will follow up with me again 2 weeks.  Worsening symptoms imaging will be needed.

## 2018-02-15 NOTE — Progress Notes (Signed)
[name] [address], [place] [address] Phone: [phone] Subjective:    I'm seeing this patient by the request  of:    CC: Back and neck pain  [id number]  Patricia [name] is a 27 y.o. female coming in with complaint of back and neck pain.  Patient is having worsening symptoms.  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  Has responded well to manipulation.      Past Medical History:  Diagnosis Date  . Chicken pox   . Thyroid disease    Past Surgical History:  Procedure Laterality Date  . MOHS SURGERY  2017   nose  . WISDOM TOOTH EXTRACTION     Social History   Socioeconomic History  . Marital status: Single    Spouse name: Not on file  . Number of children: 0  . Years of education: 72  . Highest education level: Not on file  Occupational History  . Occupation: CMA    Employer: [employer]  . Financial resource strain: Not on file  . Food insecurity:    Worry: Not on file    Inability: Not on file  . Transportation needs:    Medical: Not on file    Non-medical: Not on file  Tobacco Use  . Smoking status: Never Smoker  . Smokeless tobacco: Never Used  Substance and Sexual Activity  . Alcohol use: Yes    Alcohol/week: 1.8 oz    Types: 1 Glasses of wine, 1 Cans of beer, 1 Shots of liquor per week  . Drug use: No  . Sexual activity: Yes    Birth control/protection: Pill  Lifestyle  . Physical activity:    Days per week: Not on file    Minutes per session: Not on file  . Stress: Not on file  Relationships  . Social connections:    Talks on phone: Not on file    Gets together: Not on file    Attends religious service: Not on file    Active member of club or organization: Not on file    Attends meetings of clubs or organizations: Not on file    Relationship status: Not on file  Other Topics Concern  . Not on file  Social History Narrative   Ms. Meares lives with  her father & grandmother.    Denies abuse and feels safe at home.    Allergies  Allergen Reactions  . Amoxicillin Nausea And Vomiting   Family History  Problem Relation Age of Onset  . Arthritis Father   . Hypertension Father   . Thyroid disease Father   . Diabetes Father   . Depression Mother   . Thyroid disease Sister   . Healthy Maternal Grandmother   . Diabetes Maternal Grandfather   . Hypertension Maternal Grandfather   . COPD Maternal Grandfather   . Hypertension Paternal Grandmother   . Diabetes Paternal Grandfather      Past medical history, social, surgical and family history all reviewed in electronic medical record.  No pertanent information unless stated regarding to the chief complaint.   Review of Systems:Review of systems updated and as accurate as of 02/15/18  No headache, visual changes, nausea, vomiting, diarrhea, constipation, dizziness, abdominal pain, skin rash, fevers, chills, night sweats, weight loss, swollen lymph nodes, body aches, joint swelling, muscle aches, chest pain, shortness of breath, mood changes.   Objective  There were no vitals taken for this visit.  Systems examined below as of 02/15/18   General: No apparent distress alert and oriented x3 mood and affect normal, dressed appropriately.  HEENT: Pupils equal, extraocular movements intact  Respiratory: Patient's speak in full sentences and does not appear short of breath  Cardiovascular: No lower extremity edema, non tender, no erythema  Skin: Warm dry intact with no signs of infection or rash on extremities or on axial skeleton.  Abdomen: Soft nontender  Neuro: Cranial nerves II through XII are intact, neurovascularly intact in all extremities with 2+ DTRs and 2+ pulses.  Lymph: No lymphadenopathy of posterior or anterior cervical chain or axillae bilaterally.  Gait normal with good balance and coordination.  MSK:  Non tender with full range of motion and good stability and symmetric  strength and tone of shoulders, elbows, wrist, hip, knee and ankles bilaterally.  Back exam shows significant tightness at this time.  Negative Spurling.  Patient knows lacking 5 degrees in all planes. Patient does have tightness noted.  Some mild muscle spasm in the thoracolumbar juncture.  Mild positive Faber test on the right side.  Seems to have more of a back spasm in the lumbar region.  Negative straight leg test.  Neurovascularly intact strength symmetric in the lower extremities Osteopathic findings C2 flexed rotated and side bent right T3 extended rotated and side bent right inhaled third rib T6 extended rotated and side bent left L2 flexed rotated and side bent right Sacrum right on right    Impression and Recommendations:     This case required medical decision making of moderate complexity.      Note: This dictation was prepared with Dragon dictation along with smaller phrase technology. Any transcriptional errors that result from this process are unintentional.

## 2018-02-15 NOTE — Patient Instructions (Signed)
Sorry you are hurting.  Ice is your friend. Ice 20 minutes 2 times daily. Usually after activity and before bed. If not better talk to me and we can do prednisone .  Muscle relaxer at night  Stay hydrated  Stay active.

## 2018-02-18 DIAGNOSIS — L814 Other melanin hyperpigmentation: Secondary | ICD-10-CM | POA: Diagnosis not present

## 2018-02-18 DIAGNOSIS — D1801 Hemangioma of skin and subcutaneous tissue: Secondary | ICD-10-CM | POA: Diagnosis not present

## 2018-02-18 DIAGNOSIS — D225 Melanocytic nevi of trunk: Secondary | ICD-10-CM | POA: Diagnosis not present

## 2018-02-18 DIAGNOSIS — Z85828 Personal history of other malignant neoplasm of skin: Secondary | ICD-10-CM | POA: Diagnosis not present

## 2018-02-18 DIAGNOSIS — L218 Other seborrheic dermatitis: Secondary | ICD-10-CM | POA: Diagnosis not present

## 2018-02-18 DIAGNOSIS — L718 Other rosacea: Secondary | ICD-10-CM | POA: Diagnosis not present

## 2018-03-05 MED FILL — DROSPIREN-ETH ESTRAD-LEVOME: 3-0.02-0.45 | 70 days supply | Qty: 84 | Fill #1

## 2018-03-30 ENCOUNTER — Ambulatory Visit (INDEPENDENT_AMBULATORY_CARE_PROVIDER_SITE_OTHER): Payer: 59

## 2018-03-30 DIAGNOSIS — Z111 Encounter for screening for respiratory tuberculosis: Secondary | ICD-10-CM | POA: Diagnosis not present

## 2018-04-01 LAB — TB SKIN TEST
INDURATION: 0 mm
TB SKIN TEST: NEGATIVE

## 2018-04-06 ENCOUNTER — Ambulatory Visit (INDEPENDENT_AMBULATORY_CARE_PROVIDER_SITE_OTHER): Payer: 59

## 2018-04-06 DIAGNOSIS — Z111 Encounter for screening for respiratory tuberculosis: Secondary | ICD-10-CM

## 2018-04-08 LAB — TB SKIN TEST
INDURATION: 0 mm
TB SKIN TEST: NEGATIVE

## 2018-04-20 MED FILL — LEVOTHYROXINE 75 MCG TABLET: 75 | 84 days supply | Qty: 108 | Fill #1

## 2018-05-20 ENCOUNTER — Ambulatory Visit (INDEPENDENT_AMBULATORY_CARE_PROVIDER_SITE_OTHER): Payer: 59 | Admitting: Family Medicine

## 2018-05-20 ENCOUNTER — Ambulatory Visit (INDEPENDENT_AMBULATORY_CARE_PROVIDER_SITE_OTHER)
Admission: RE | Admit: 2018-05-20 | Discharge: 2018-05-20 | Disposition: A | Discharge status: Home / Self Care | Payer: 59 | Source: Ambulatory Visit | Attending: Family Medicine | Admitting: Family Medicine

## 2018-05-20 ENCOUNTER — Other Ambulatory Visit: Payer: Self-pay | Admitting: Family Medicine

## 2018-05-20 DIAGNOSIS — M545 Low back pain, unspecified: Secondary | ICD-10-CM

## 2018-05-20 DIAGNOSIS — M62830 Muscle spasm of back: Secondary | ICD-10-CM | POA: Insufficient documentation

## 2018-05-20 DIAGNOSIS — M999 Biomechanical lesion, unspecified: Secondary | ICD-10-CM | POA: Insufficient documentation

## 2018-05-20 MED ORDER — TIZANIDINE HCL 4 MG PO TABS: 4.0000 mg | ORAL_TABLET | Freq: Every evening | ORAL | 2 refills | Status: DC

## 2018-05-20 MED ORDER — PREDNISONE 50 MG PO TABS: 50.0000 mg | ORAL_TABLET | Freq: Every day | ORAL | 0 refills | Status: DC

## 2018-05-20 MED ORDER — TIZANIDINE HCL 4 MG PO TABS: 4.0000 mg | ORAL_TABLET | Freq: Every evening | ORAL | 2 refills | Status: AC

## 2018-05-20 MED FILL — DROSPIREN-ETH ESTRAD-LEVOME: 3-0.02-0.45 | 70 days supply | Qty: 84 | Fill #2

## 2018-05-20 NOTE — Patient Instructions (Signed)
I am sorry you are hurting.  Xray downstairs Ice 20 minutes 2 times daily. Usually after activity and before bed. Starting tomorrow Prednisone daily for 5 days Zanaflex at night for next 3 nights then as needed.  See me again 2 weeks

## 2018-05-20 NOTE — Assessment & Plan Note (Signed)
Patient is a more of a lumbar spasm.  Discussed with patient at this time with the pain being greater than 2 weeks we will get x-rays.  Discussed icing regimen, prednisone given, as well as muscle relaxer.  Warned of potential side effects.  Follow-up again in 2 weeks

## 2018-05-20 NOTE — Progress Notes (Signed)
[name] [address], [place] [address] Phone: [phone] Subjective:     CC: Back pain  [id number]  Patricia [name] is a 27 y.o. female coming in with complaint of back pain.  Patient was seen previously and has had an exacerbation.  Has had more of a slipped rib syndrome but now lower back pain.  Has been playing softball.  No radiation down the legs.  States the pain is approximately 8 out of 10.  Seems to be worsening over the course the last 2 weeks.  Denies any fevers, chills, any abnormal weight loss.  Denies any dysuria.  No association with menstruation.     Past Medical History:  Diagnosis Date  . Chicken pox   . Thyroid disease    Past Surgical History:  Procedure Laterality Date  . MOHS SURGERY  2017   nose  . WISDOM TOOTH EXTRACTION     Social History   Socioeconomic History  . Marital status: Single    Spouse name: Not on file  . Number of children: 0  . Years of education: 65  . Highest education level: Not on file  Occupational History  . Occupation: CMA    Employer: [employer]  . Financial resource strain: Not on file  . Food insecurity:    Worry: Not on file    Inability: Not on file  . Transportation needs:    Medical: Not on file    Non-medical: Not on file  Tobacco Use  . Smoking status: Never Smoker  . Smokeless tobacco: Never Used  Substance and Sexual Activity  . Alcohol use: Yes    Alcohol/week: 1.8 oz    Types: 1 Glasses of wine, 1 Cans of beer, 1 Shots of liquor per week  . Drug use: No  . Sexual activity: Yes    Birth control/protection: Pill  Lifestyle  . Physical activity:    Days per week: Not on file    Minutes per session: Not on file  . Stress: Not on file  Relationships  . Social connections:    Talks on phone: Not on file    Gets together: Not on file    Attends religious service: Not on file    Active member of club or organization: Not on  file    Attends meetings of clubs or organizations: Not on file    Relationship status: Not on file  Other Topics Concern  . Not on file  Social History Narrative   Ms. Urbanowicz lives with her father & grandmother.    Denies abuse and feels safe at home.    Allergies  Allergen Reactions  . Amoxicillin Nausea And Vomiting   Family History  Problem Relation Age of Onset  . Arthritis Father   . Hypertension Father   . Thyroid disease Father   . Diabetes Father   . Depression Mother   . Thyroid disease Sister   . Healthy Maternal Grandmother   . Diabetes Maternal Grandfather   . Hypertension Maternal Grandfather   . COPD Maternal Grandfather   . Hypertension Paternal Grandmother   . Diabetes Paternal Grandfather      Past medical history, social, surgical and family history all reviewed in electronic medical record.  No pertanent information unless stated regarding to the chief complaint.   Review of Systems:Review of systems updated and as accurate as of 05/20/18  No headache, visual changes, nausea, vomiting, diarrhea, constipation,  dizziness, abdominal pain, skin rash, fevers, chills, night sweats, weight loss, swollen lymph nodes, body aches, joint swelling, muscle aches, chest pain, shortness of breath, mood changes.   Objective  There were no vitals taken for this visit. Systems examined below as of 05/20/18   General: No apparent distress alert and oriented x3 mood and affect normal, dressed appropriately.  HEENT: Pupils equal, extraocular movements intact  Respiratory: Patient's speak in full sentences and does not appear short of breath  Cardiovascular: No lower extremity edema, non tender, no erythema  Skin: Warm dry intact with no signs of infection or rash on extremities or on axial skeleton.  Abdomen: Soft nontender  Neuro: Cranial nerves II through XII are intact, neurovascularly intact in all extremities with 2+ DTRs and 2+ pulses.  Lymph: No lymphadenopathy  of posterior or anterior cervical chain or axillae bilaterally.  Gait normal with good balance and coordination.  MSK:  Non tender with full range of motion and good stability and symmetric strength and tone of shoulders, elbows, wrist, hip, knee and ankles bilaterally.  Back Exam:  Inspection: Mild loss of lordosis Motion: Flexion 25 deg, Extension 25 deg, Side Bending to 35 deg bilaterally,  Rotation to 35 deg bilaterally  SLR laying: Negative  XSLR laying: Negative  Palpable tenderness: Tender to palpation the paraspinal musculature lumbar spine right greater than left. FABER: Tightness bilaterally. Sensory change: Gross sensation intact to all lumbar and sacral dermatomes.  Reflexes: 2+ at both patellar tendons, 2+ at achilles tendons, Babinski's downgoing.  Strength at foot  Plantar-flexion: 5/5 Dorsi-flexion: 5/5 Eversion: 5/5 Inversion: 5/5  Leg strength  Quad: 5/5 Hamstring: 5/5 Hip flexor: 5/5 Hip abductors: 5/5  Gait unremarkable.  Osteopathic findings C6 flexed rotated and side bent left T3 extended rotated and side bent right inhaled third rib L1 flexed rotated and side bent right Sacrum right on right    Impression and Recommendations:     This case required medical decision making of moderate complexity.      Note: This dictation was prepared with Dragon dictation along with smaller phrase technology. Any transcriptional errors that result from this process are unintentional.

## 2018-05-20 NOTE — Assessment & Plan Note (Signed)
Decision today to treat with OMT was based on Physical Exam  After verbal consent patient was treated with HVLA, ME, FPR techniques in cervical, thoracic, rib lumbar and sacral areas  Patient tolerated the procedure well with improvement in symptoms  Patient given exercises, stretches and lifestyle modifications  See medications in patient instructions if given  Patient will follow up in 2 weeks

## 2018-06-16 ENCOUNTER — Other Ambulatory Visit: Payer: Self-pay | Admitting: *Deleted

## 2018-06-16 DIAGNOSIS — M4807 Spinal stenosis, lumbosacral region: Secondary | ICD-10-CM

## 2018-06-22 ENCOUNTER — Ambulatory Visit
Admission: RE | Admit: 2018-06-22 | Discharge: 2018-06-22 | Disposition: A | Discharge status: Home / Self Care | Payer: 59 | Source: Ambulatory Visit | Attending: Family Medicine | Admitting: Family Medicine

## 2018-06-22 DIAGNOSIS — M4807 Spinal stenosis, lumbosacral region: Secondary | ICD-10-CM

## 2018-06-22 DIAGNOSIS — M48061 Spinal stenosis, lumbar region without neurogenic claudication: Secondary | ICD-10-CM | POA: Diagnosis not present

## 2018-07-06 DIAGNOSIS — H5213 Myopia, bilateral: Secondary | ICD-10-CM | POA: Diagnosis not present

## 2018-07-19 MED FILL — LEVOTHYROXINE 75 MCG TABLET: 75 | 84 days supply | Qty: 108 | Fill #2

## 2018-07-19 MED FILL — FLUTICASONE PROP 0.05% CRM: 0.05 | 20 days supply | Qty: 60 | Fill #0

## 2018-08-04 ENCOUNTER — Ambulatory Visit (INDEPENDENT_AMBULATORY_CARE_PROVIDER_SITE_OTHER): Payer: 59 | Admitting: Family Medicine

## 2018-08-04 ENCOUNTER — Encounter: Payer: Self-pay | Admitting: Family Medicine

## 2018-08-04 VITALS — Ht 66.0 in

## 2018-08-04 DIAGNOSIS — M999 Biomechanical lesion, unspecified: Secondary | ICD-10-CM

## 2018-08-04 DIAGNOSIS — M940 Chondrocostal junction syndrome [Tietze]: Secondary | ICD-10-CM

## 2018-08-04 NOTE — Assessment & Plan Note (Signed)
Decision today to treat with OMT was based on Physical Exam  After verbal consent patient was treated with HVLA, ME, FPR techniques in cervical, thoracic, rib,  lumbar and sacral areas  Patient tolerated the procedure well with improvement in symptoms  Patient given exercises, stretches and lifestyle modifications  See medications in patient instructions if given  Patient will follow up in 4-8 weeks 

## 2018-08-04 NOTE — Assessment & Plan Note (Signed)
Discussed posture and ergonomics.  Discussed which activities to do which wants to avoid.  Follow-up again in 4 to 8 weeks 

## 2018-08-04 NOTE — Progress Notes (Signed)
[name] [address], [place] [address] Phone: [phone] Subjective:     CC: Back pain follow-up  [id number]  Patricia [name] is a 27 y.o. female coming in with complaint of back pain.  Has been seen multiple times.  Patient has a slipped rib syndrome.  Patient is studying more and sitting more.  Not working out as much.  Does have some lower back pain as well secondary to patient's menstruation as well as plain softball.  Played more frequently than usual.  Denies any radiation of the legs or any numbness.  Has had advanced imaging of the back that was unremarkable.    Past Medical History:  Diagnosis Date  . Chicken pox   . Thyroid disease    Past Surgical History:  Procedure Laterality Date  . MOHS SURGERY  2017   nose  . WISDOM TOOTH EXTRACTION     Social History   Socioeconomic History  . Marital status: Single    Spouse name: Not on file  . Number of children: 0  . Years of education: 11  . Highest education level: Not on file  Occupational History  . Occupation: CMA    Employer: [employer]  . Financial resource strain: Not on file  . Food insecurity:    Worry: Not on file    Inability: Not on file  . Transportation needs:    Medical: Not on file    Non-medical: Not on file  Tobacco Use  . Smoking status: Never Smoker  . Smokeless tobacco: Never Used  Substance and Sexual Activity  . Alcohol use: Yes    Alcohol/week: 3.0 standard drinks    Types: 1 Glasses of wine, 1 Cans of beer, 1 Shots of liquor per week  . Drug use: No  . Sexual activity: Yes    Birth control/protection: Pill  Lifestyle  . Physical activity:    Days per week: Not on file    Minutes per session: Not on file  . Stress: Not on file  Relationships  . Social connections:    Talks on phone: Not on file    Gets together: Not on file    Attends religious service: Not on file    Active member of club or  organization: Not on file    Attends meetings of clubs or organizations: Not on file    Relationship status: Not on file  Other Topics Concern  . Not on file  Social History Narrative   Ms. Vandevender lives with her father & grandmother.    Denies abuse and feels safe at home.    Allergies  Allergen Reactions  . Amoxicillin Nausea And Vomiting   Family History  Problem Relation Age of Onset  . Arthritis Father   . Hypertension Father   . Thyroid disease Father   . Diabetes Father   . Depression Mother   . Thyroid disease Sister   . Healthy Maternal Grandmother   . Diabetes Maternal Grandfather   . Hypertension Maternal Grandfather   . COPD Maternal Grandfather   . Hypertension Paternal Grandmother   . Diabetes Paternal Grandfather     Current Outpatient Medications (Endocrine & Metabolic):  .  BEYAZ 3-0.02-0.451 MG tablet,  .  levothyroxine (SYNTHROID, LEVOTHROID) 75 MCG tablet, Take 1 tablet (75 mcg total) by mouth daily before breakfast. Twice a week take 2 pills .  predniSONE (DELTASONE) 50 MG tablet, Take 1 tablet (50 mg  total) by mouth daily.    Current Outpatient Medications (Analgesics):  .  meloxicam (MOBIC) 15 MG tablet, Take 1 tablet (15 mg total) by mouth daily.      Past medical history, social, surgical and family history all reviewed in electronic medical record.  No pertanent information unless stated regarding to the chief complaint.   Review of Systems:  No headache, visual changes, nausea, vomiting, diarrhea, constipation, dizziness, abdominal pain, skin rash, fevers, chills, night sweats, weight loss, swollen lymph nodes, body aches, joint swelling, chest pain, shortness of breath, mood changes.  Positive muscle aches  Objective  Height 5\' 6"  (1.676 m).   General: No apparent distress alert and oriented x3 mood and affect normal, dressed appropriately.  HEENT: Pupils equal, extraocular movements intact  Respiratory: Patient's speak in full  sentences and does not appear short of breath  Cardiovascular: No lower extremity edema, non tender, no erythema  Skin: Warm dry intact with no signs of infection or rash on extremities or on axial skeleton.  Abdomen: Soft nontender  Neuro: Cranial nerves II through XII are intact, neurovascularly intact in all extremities with 2+ DTRs and 2+ pulses.  Lymph: No lymphadenopathy of posterior or anterior cervical chain or axillae bilaterally.  Gait normal with good balance and coordination.  MSK:  Non tender with full range of motion and good stability and symmetric strength and tone of shoulders, elbows, wrist, hip, knee and ankles bilaterally.  Neck: Inspection loss of lordosis. No palpable stepoffs. Negative Spurling's maneuver. = Mild loss of extension Grip strength and sensation normal in bilateral hands Strength good C4 to T1 distribution No sensory change to C4 to T1 Negative Hoffman sign bilaterally Reflexes normal Tightness of the trapezius bilaterally  Back Exam:  Inspection: Unremarkable  Motion: Flexion 45 deg, Extension 25 deg, Side Bending to 30 deg bilaterally,  Rotation to 45 deg bilaterally  SLR laying: Negative  XSLR laying: Negative  Palpable tenderness: Tender to palpation the paraspinal musculature lumbar spine right greater than left. FABER: negative. Sensory change: Gross sensation intact to all lumbar and sacral dermatomes.  Reflexes: 2+ at both patellar tendons, 2+ at achilles tendons, Babinski's downgoing.  Strength at foot  Plantar-flexion: 5/5 Dorsi-flexion: 5/5 Eversion: 5/5 Inversion: 5/5  Leg strength  Quad: 5/5 Hamstring: 5/5 Hip flexor: 5/5 Hip abductors: 5/5  Gait unremarkable.  Osteopathic findings  C2 flexed rotated and side bent right C4 flexed rotated and side bent left T4 extended rotated and side bent right inhaled rib T9 extended rotated and side bent left L2 flexed rotated and side bent right Sacrum right on right     Impression  and Recommendations:     This case required medical decision making of moderate complexity. The above documentation has been reviewed and is accurate and complete Lyndal Pulley, DO       Note: This dictation was prepared with Dragon dictation along with smaller phrase technology. Any transcriptional errors that result from this process are unintentional.

## 2018-08-04 NOTE — Patient Instructions (Addendum)
Calcium pyruvate 1500mg  daily  Stay active Find time for yourself.  See you when you need me

## 2018-08-06 MED FILL — DROSPIREN-ETH ESTRAD-LEVOME: 3-0.02-0.45 | 70 days supply | Qty: 84 | Fill #3

## 2018-09-15 ENCOUNTER — Encounter: Payer: Self-pay | Admitting: Family Medicine

## 2018-09-15 ENCOUNTER — Ambulatory Visit (INDEPENDENT_AMBULATORY_CARE_PROVIDER_SITE_OTHER): Payer: 59 | Admitting: Family Medicine

## 2018-09-15 DIAGNOSIS — M62830 Muscle spasm of back: Secondary | ICD-10-CM

## 2018-09-15 DIAGNOSIS — M999 Biomechanical lesion, unspecified: Secondary | ICD-10-CM | POA: Diagnosis not present

## 2018-09-15 NOTE — Patient Instructions (Signed)
Good to see yo  Patricia [name] is your friend

## 2018-09-15 NOTE — Progress Notes (Signed)
[name] [address] [place], [place] [address] Phone: [phone] Subjective:     CC: neck and back pain   [id number]  Patricia [name] is a 27 y.o. female coming in with complaint of neck and back pain.  Some increasing tightness.  Patient has not been doing the exercises regularly.  And it difficult secondary to patient having to travel more often.  Patient is working nearly full-time as well as going to school.  Some increasing stress.  Went hunting this weekend and feels that that could have contributed to more the worsening discomfort and pain.  Has had MRIs that have been unremarkable.    Past Medical History:  Diagnosis Date  . Chicken pox   . Thyroid disease    Past Surgical History:  Procedure Laterality Date  . MOHS SURGERY  2017   nose  . WISDOM TOOTH EXTRACTION     Social History   Socioeconomic History  . Marital status: Single    Spouse name: Not on file  . Number of children: 0  . Years of education: 63  . Highest education level: Not on file  Occupational History  . Occupation: CMA    Employer: [employer]  . Financial resource strain: Not on file  . Food insecurity:    Worry: Not on file    Inability: Not on file  . Transportation needs:    Medical: Not on file    Non-medical: Not on file  Tobacco Use  . Smoking status: Never Smoker  . Smokeless tobacco: Never Used  Substance and Sexual Activity  . Alcohol use: Yes    Alcohol/week: 3.0 standard drinks    Types: 1 Glasses of wine, 1 Cans of beer, 1 Shots of liquor per week  . Drug use: No  . Sexual activity: Yes    Birth control/protection: Pill  Lifestyle  . Physical activity:    Days per week: Not on file    Minutes per session: Not on file  . Stress: Not on file  Relationships  . Social connections:    Talks on phone: Not on file    Gets together: Not on file    Attends religious service: Not on file    Active member  of club or organization: Not on file    Attends meetings of clubs or organizations: Not on file    Relationship status: Not on file  Other Topics Concern  . Not on file  Social History Narrative   Patricia [name] lives with her father & grandmother.    Denies abuse and feels safe at home.    Allergies  Allergen Reactions  . Amoxicillin Nausea And Vomiting   Family History  Problem Relation Age of Onset  . Arthritis Father   . Hypertension Father   . Thyroid disease Father   . Diabetes Father   . Depression Mother   . Thyroid disease Sister   . Healthy Maternal Grandmother   . Diabetes Maternal Grandfather   . Hypertension Maternal Grandfather   . COPD Maternal Grandfather   . Hypertension Paternal Grandmother   . Diabetes Paternal Grandfather     Current Outpatient Medications (Endocrine & Metabolic):  .  BEYAZ 3-0.02-0.451 MG tablet,  .  levothyroxine (SYNTHROID, LEVOTHROID) 75 MCG tablet, Take 1 tablet (75 mcg total) by mouth daily before breakfast. Twice a week take 2 pills .  predniSONE (DELTASONE) 50 MG tablet, Take 1 tablet (50 mg  total) by mouth daily.    Current Outpatient Medications (Analgesics):  .  meloxicam (MOBIC) 15 MG tablet, Take 1 tablet (15 mg total) by mouth daily.      Past medical history, social, surgical and family history all reviewed in electronic medical record.  No pertanent information unless stated regarding to the chief complaint.   Review of Systems:  No headache, visual changes, nausea, vomiting, diarrhea, constipation, dizziness, abdominal pain, skin rash, fevers, chills, night sweats, weight loss, swollen lymph nodes, body aches, joint swelling, muscle aches, chest pain, shortness of breath, mood changes.   Objective  There were no vitals taken for this visit. Systems examined below as of    General: No apparent distress alert and oriented x3 mood and affect normal, dressed appropriately.  HEENT: Pupils equal, extraocular movements  intact  Respiratory: Patient's speak in full sentences and does not appear short of breath  Cardiovascular: No lower extremity edema, non tender, no erythema  Skin: Warm dry intact with no signs of infection or rash on extremities or on axial skeleton.  Abdomen: Soft nontender  Neuro: Cranial nerves II through XII are intact, neurovascularly intact in all extremities with 2+ DTRs and 2+ pulses.  Lymph: No lymphadenopathy of posterior or anterior cervical chain or axillae bilaterally.  Gait normal with good balance and coordination.  MSK:  Non tender with full range of motion and good stability and symmetric strength and tone of shoulders, elbows, wrist, hip, knee and ankles bilaterally.  Back Exam:  Inspection: Unremarkable  Motion: Flexion 45 deg, Extension 20 deg, Side Bending to 45 deg bilaterally,  Rotation to 45 deg bilaterally  SLR laying: Negative  XSLR laying: Negative  Palpable tenderness: Tender to palpation the paraspinal musculature of the breakthrough pain. FABER: Tightness bilaterally.  Pain over the left sacroiliac joint Sensory change: Gross sensation intact to all lumbar and sacral dermatomes.  Reflexes: 2+ at both patellar tendons, 2+ at achilles tendons, Babinski's downgoing.  Strength at foot  Plantar-flexion: 5/5 Dorsi-flexion: 5/5 Eversion: 5/5 Inversion: 5/5  Leg strength  Quad: 5/5 Hamstring: 5/5 Hip flexor: 5/5 Hip abductors: 5/5  Gait unremarkable.   Osteopathic findings C2 flexed rotated and side bent right C6 flexed rotated and side bent left T3 extended rotated and side bent right inhaled third rib T7 extended rotated and side bent left L2 flexed rotated and side bent right Sacrum right on right    Impression and Recommendations:     This case required medical decision making of moderate complexity. The above documentation has been reviewed and is accurate and complete Lyndal Pulley, DO       Note: This dictation was prepared with Dragon  dictation along with smaller phrase technology. Any transcriptional errors that result from this process are unintentional.

## 2018-09-15 NOTE — Assessment & Plan Note (Signed)
Decision today to treat with OMT was based on Physical Exam  After verbal consent patient was treated with HVLA, ME, FPR techniques in cervical, thoracic, rib lumbar and sacral areas  Patient tolerated the procedure well with improvement in symptoms  Patient given exercises, stretches and lifestyle modifications  See medications in patient instructions if given  Patient will follow up in 4 weeks 

## 2018-09-15 NOTE — Assessment & Plan Note (Signed)
Mild worsening spasm.  Patient has been driving more often.  Not being able to do as much exercise.  Increase in stress as well as likely contributing to some of the aches and pains.  Discussed which activities to do which was to avoid.  Patient will continue to work on posture and see me every 3 months as needed for manipulation.

## 2018-10-11 ENCOUNTER — Other Ambulatory Visit: Payer: Self-pay | Admitting: Internal Medicine

## 2018-10-11 DIAGNOSIS — E038 Other specified hypothyroidism: Secondary | ICD-10-CM

## 2018-10-11 DIAGNOSIS — E063 Autoimmune thyroiditis: Principal | ICD-10-CM

## 2018-10-11 MED ORDER — ESCITALOPRAM OXALATE 10 MG PO TABS: 10.0000 mg | ORAL_TABLET | Freq: Every day | ORAL | 5 refills | Status: DC

## 2018-10-11 MED FILL — ESCITALOPRAM 10 MG TABLET: 10 | 30 days supply | Qty: 30 | Fill #0

## 2018-10-13 ENCOUNTER — Other Ambulatory Visit: Payer: Self-pay

## 2018-10-13 DIAGNOSIS — E063 Autoimmune thyroiditis: Principal | ICD-10-CM

## 2018-10-13 DIAGNOSIS — E038 Other specified hypothyroidism: Secondary | ICD-10-CM

## 2018-10-13 MED ORDER — LEVOTHYROXINE SODIUM 75 MCG PO TABS: ORAL_TABLET | ORAL | 0 refills | Status: DC

## 2018-10-13 MED FILL — LEVOTHYROXINE 75 MCG TABLET: 75 | 88 days supply | Qty: 114 | Fill #0

## 2018-10-18 MED FILL — DROSPIREN-ETH ESTRAD-LEVOME: 3-0.02-0.45 | 70 days supply | Qty: 84 | Fill #4

## 2018-11-04 ENCOUNTER — Other Ambulatory Visit: Payer: Self-pay | Admitting: Internal Medicine

## 2018-11-04 DIAGNOSIS — F3289 Other specified depressive episodes: Secondary | ICD-10-CM

## 2018-11-04 DIAGNOSIS — F419 Anxiety disorder, unspecified: Secondary | ICD-10-CM

## 2018-11-08 ENCOUNTER — Ambulatory Visit (INDEPENDENT_AMBULATORY_CARE_PROVIDER_SITE_OTHER): Payer: No Typology Code available for payment source | Admitting: Psychology

## 2018-11-08 DIAGNOSIS — Z634 Disappearance and death of family member: Secondary | ICD-10-CM | POA: Diagnosis not present

## 2018-11-08 DIAGNOSIS — F329 Major depressive disorder, single episode, unspecified: Secondary | ICD-10-CM | POA: Diagnosis not present

## 2018-11-08 MED FILL — ESCITALOPRAM 10 MG TABLET: 10 | 30 days supply | Qty: 30 | Fill #1

## 2018-11-15 ENCOUNTER — Other Ambulatory Visit: Payer: Self-pay | Admitting: *Deleted

## 2018-11-15 MED ORDER — TIZANIDINE HCL 4 MG PO TABS: 4.0000 mg | ORAL_TABLET | Freq: Every evening | ORAL | 0 refills | Status: AC

## 2018-11-15 MED FILL — tiZANidine HCL 4 MG TABS: 4 | 30 days supply | Qty: 30 | Fill #0

## 2018-11-17 ENCOUNTER — Ambulatory Visit (INDEPENDENT_AMBULATORY_CARE_PROVIDER_SITE_OTHER): Payer: No Typology Code available for payment source | Admitting: Psychology

## 2018-11-17 DIAGNOSIS — F329 Major depressive disorder, single episode, unspecified: Secondary | ICD-10-CM

## 2018-11-17 DIAGNOSIS — Z634 Disappearance and death of family member: Secondary | ICD-10-CM

## 2018-11-29 ENCOUNTER — Ambulatory Visit: Payer: Self-pay | Admitting: Psychology

## 2018-12-08 ENCOUNTER — Ambulatory Visit (INDEPENDENT_AMBULATORY_CARE_PROVIDER_SITE_OTHER): Payer: No Typology Code available for payment source | Admitting: Psychology

## 2018-12-08 DIAGNOSIS — Z634 Disappearance and death of family member: Secondary | ICD-10-CM

## 2018-12-08 DIAGNOSIS — F329 Major depressive disorder, single episode, unspecified: Secondary | ICD-10-CM

## 2018-12-10 MED FILL — ESCITALOPRAM 10 MG TABLET: 10 | 30 days supply | Qty: 30 | Fill #2

## 2018-12-21 MED FILL — DROSPIREN-ETH ESTRAD-LEVOME: 3-0.02-0.45 | 70 days supply | Qty: 84 | Fill #0

## 2018-12-23 ENCOUNTER — Ambulatory Visit: Payer: No Typology Code available for payment source | Admitting: Family Medicine

## 2018-12-23 ENCOUNTER — Encounter: Payer: Self-pay | Admitting: Family Medicine

## 2018-12-23 VITALS — BP 100/70 | HR 80 | Ht 66.0 in | Wt 142.0 lb

## 2018-12-23 DIAGNOSIS — M940 Chondrocostal junction syndrome [Tietze]: Secondary | ICD-10-CM

## 2018-12-23 DIAGNOSIS — M999 Biomechanical lesion, unspecified: Secondary | ICD-10-CM | POA: Diagnosis not present

## 2018-12-23 NOTE — Progress Notes (Signed)
[name] [address] [place], [place] [address] Phone: [phone] Subjective:    I Patricia [name] am serving as a Education administrator for Dr. Hulan Saas.   CC: Neck pain follow-up  [id number]  Patricia [name] is a 28 y.o. female coming in with complaint of back pain. States that her back and neck is painful. Sunday had issues with extension. Rotation was also painful.  No radiation down the arm or any numbness or tingling.  No weakness.  States some chronic pain with any type of movement.  Has responded well to manipulation in the past     Past Medical History:  Diagnosis Date  . Chicken pox   . Thyroid disease    Past Surgical History:  Procedure Laterality Date  . MOHS SURGERY  2017   nose  . WISDOM TOOTH EXTRACTION     Social History   Socioeconomic History  . Marital status: Single    Spouse name: Not on file  . Number of children: 0  . Years of education: 35  . Highest education level: Not on file  Occupational History  . Occupation: CMA    Employer: [employer]  . Financial resource strain: Not on file  . Food insecurity:    Worry: Not on file    Inability: Not on file  . Transportation needs:    Medical: Not on file    Non-medical: Not on file  Tobacco Use  . Smoking status: Never Smoker  . Smokeless tobacco: Never Used  Substance and Sexual Activity  . Alcohol use: Yes    Alcohol/week: 3.0 standard drinks    Types: 1 Glasses of wine, 1 Cans of beer, 1 Shots of liquor per week  . Drug use: No  . Sexual activity: Yes    Birth control/protection: Pill  Lifestyle  . Physical activity:    Days per week: Not on file    Minutes per session: Not on file  . Stress: Not on file  Relationships  . Social connections:    Talks on phone: Not on file    Gets together: Not on file    Attends religious service: Not on file    Active member of club or organization: Not on file    Attends meetings of  clubs or organizations: Not on file    Relationship status: Not on file  Other Topics Concern  . Not on file  Social History Narrative   Ms. Espinoza lives with her father & grandmother.    Denies abuse and feels safe at home.    Allergies  Allergen Reactions  . Amoxicillin Nausea And Vomiting   Family History  Problem Relation Age of Onset  . Arthritis Father   . Hypertension Father   . Thyroid disease Father   . Diabetes Father   . Depression Mother   . Thyroid disease Sister   . Healthy Maternal Grandmother   . Diabetes Maternal Grandfather   . Hypertension Maternal Grandfather   . COPD Maternal Grandfather   . Hypertension Paternal Grandmother   . Diabetes Paternal Grandfather     Current Outpatient Medications (Endocrine & Metabolic):  .  BEYAZ 3-0.02-0.451 MG tablet,  .  levothyroxine (SYNTHROID, LEVOTHROID) 75 MCG tablet, TAKE 1 TABLET BY MOUTH ONCE DAILY BEFORE BREAKFAST, EXCEPT FOR TWICE A WEEK TAKE 2 TABLETS    Current Outpatient Medications (Analgesics):  .  meloxicam (MOBIC) 15 MG tablet, Take 1 tablet (15 mg  total) by mouth daily.   Current Outpatient Medications (Other):  .  escitalopram (LEXAPRO) 10 MG tablet, Take 1 tablet (10 mg total) by mouth daily.    Past medical history, social, surgical and family history all reviewed in electronic medical record.  No pertanent information unless stated regarding to the chief complaint.   Review of Systems:  No headache, visual changes, nausea, vomiting, diarrhea, constipation, dizziness, abdominal pain, skin rash, fevers, chills, night sweats, weight loss, swollen lymph nodes, body aches, joint swelling, muscle aches, chest pain, shortness of breath, mood changes.   Objective  Blood pressure 100/70, pulse 80, height 5\' 6"  (1.676 m), weight 142 lb (64.4 kg), SpO2 98 %.   General: No apparent distress alert and oriented x3 mood and affect normal, dressed appropriately.  HEENT: Pupils equal, extraocular  movements intact  Respiratory: Patient's speak in full sentences and does not appear short of breath  Cardiovascular: No lower extremity edema, non tender, no erythema  Skin: Warm dry intact with no signs of infection or rash on extremities or on axial skeleton.  Abdomen: Soft nontender  Neuro: Cranial nerves II through XII are intact, neurovascularly intact in all extremities with 2+ DTRs and 2+ pulses.  Lymph: No lymphadenopathy of posterior or anterior cervical chain or axillae bilaterally.  Gait normal with good balance and coordination.  MSK:  Non tender with full range of motion and good stability and symmetric strength and tone of shoulders, elbows, wrist, hip, knee and ankles bilaterally.  Neck: Inspection mild loss of lordosis. No palpable stepoffs. Negative Spurling's maneuver. Mild limited range of motion in sidebending and rotation to the right of 5 to 10 degrees Grip strength and sensation normal in bilateral hands Strength good C4 to T1 distribution No sensory change to C4 to T1 Negative Hoffman sign bilaterally Reflexes normal tightnessof the right trapezius noted trigger points noted in the right trapezius  Osteopathic findings C2 flexed rotated and side bent right C4 flexed rotated and side bent left C6 flexed rotated and side bent left T3 extended rotated and side bent right inhaled third rib     Impression and Recommendations:     This case required medical decision making of moderate complexity. The above documentation has been reviewed and is accurate and complete Lyndal Pulley, DO       Note: This dictation was prepared with Dragon dictation along with smaller phrase technology. Any transcriptional errors that result from this process are unintentional.

## 2018-12-23 NOTE — Patient Instructions (Signed)
Good to see you  Ice is your friend Stay active Prilosec daily for 2 weeks

## 2018-12-23 NOTE — Assessment & Plan Note (Signed)
Decision today to treat with OMT was based on Physical Exam  After verbal consent patient was treated with HVLA, ME, FPR techniques in cervical, thoracic, rib areas  Patient tolerated the procedure well with improvement in symptoms  Patient given exercises, stretches and lifestyle modifications  See medications in patient instructions if given  Patient will follow up in 4-6 weeks 

## 2018-12-23 NOTE — Assessment & Plan Note (Signed)
Fluid from syndrome noted again.  Tightness of the neck.  Possible viscerosomatic reflex.  Discussed icing regimen and home exercise, discussed which activities to do which wants to avoid.  Patient is to increase activity slowly over the course of next several days.  Patient will try some over-the-counter medications.  Follow-up again in 4 to 6 weeks worsening pain consider tender points

## 2018-12-28 ENCOUNTER — Ambulatory Visit (INDEPENDENT_AMBULATORY_CARE_PROVIDER_SITE_OTHER): Payer: No Typology Code available for payment source | Admitting: Psychology

## 2018-12-28 DIAGNOSIS — F329 Major depressive disorder, single episode, unspecified: Secondary | ICD-10-CM

## 2018-12-28 DIAGNOSIS — Z634 Disappearance and death of family member: Secondary | ICD-10-CM | POA: Diagnosis not present

## 2019-01-06 ENCOUNTER — Other Ambulatory Visit: Payer: Self-pay

## 2019-01-06 DIAGNOSIS — E038 Other specified hypothyroidism: Secondary | ICD-10-CM

## 2019-01-06 DIAGNOSIS — E063 Autoimmune thyroiditis: Principal | ICD-10-CM

## 2019-01-06 MED ORDER — LEVOTHYROXINE SODIUM 75 MCG PO TABS: ORAL_TABLET | ORAL | 1 refills | Status: DC

## 2019-01-06 MED FILL — LEVOTHYROXINE 75 MCG TABLET: 75 | 88 days supply | Qty: 114 | Fill #0

## 2019-01-11 ENCOUNTER — Ambulatory Visit: Payer: No Typology Code available for payment source | Admitting: Psychology

## 2019-02-06 NOTE — Progress Notes (Signed)
Subjective:    Patient ID: Patricia [name], female    DOB: [birth year], 28 y.o.   MRN: [id number]  HPI She is here for a physical exam.   She has no concerns.    Medications and allergies reviewed with patient and updated if appropriate.  Patient Active Problem List   Diagnosis Date Noted  . Lumbar paraspinal muscle spasm 05/20/2018  . Nonallopathic lesion of lumbosacral region 05/20/2018  . Nonallopathic lesion of cervical region 10/21/2017  . Lipoma of axilla, right 10/01/2017  . Thyroid nodule 10/01/2017  . Hx of skin cancer, basal cell 01/22/2017  . Hypothyroidism 01/22/2017  . 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 Nonallopathic lesion of rib cage 12/31/2016  . Nonallopathic lesion of thoracic region 12/31/2016  . Contusion of leg, right 05/19/2016  . Back pain, chronic 10/31/2013    Current Outpatient Medications on File Prior to Visit  Medication Sig Dispense Refill  . BEYAZ 3-0.02-0.451 MG tablet     . levothyroxine (SYNTHROID, LEVOTHROID) 75 MCG tablet TAKE 1 TABLET BY MOUTH ONCE DAILY BEFORE BREAKFAST, EXCEPT FOR TWICE A WEEK TAKE 2 TABLETS 114 tablet 1  . meloxicam (MOBIC) 15 MG tablet Take 1 tablet (15 mg total) by mouth daily. 30 tablet 0   No current facility-administered medications on file prior to visit.     Past Medical History:  Diagnosis Date  . Chicken pox   . Thyroid disease     Past Surgical History:  Procedure Laterality Date  . MOHS SURGERY  2017   nose  . WISDOM TOOTH EXTRACTION      Social History   Socioeconomic History  . Marital status: Single    Spouse name: Not on file  . Number of children: 0  . Years of education: 61  . Highest education level: Not on file  Occupational History  . Occupation: CMA    Employer: [employer]  . Financial resource strain: Not on file  . Food insecurity:    Worry: Not on file    Inability: Not on file  . Transportation needs:    Medical: Not on file   Non-medical: Not on file  Tobacco Use  . Smoking status: Never Smoker  . Smokeless tobacco: Never Used  Substance and Sexual Activity  . Alcohol use: Yes    Alcohol/week: 3.0 standard drinks    Types: 1 Glasses of wine, 1 Cans of beer, 1 Shots of liquor per week  . Drug use: No  . Sexual activity: Yes    Birth control/protection: Pill  Lifestyle  . Physical activity:    Days per week: Not on file    Minutes per session: Not on file  . Stress: Not on file  Relationships  . Social connections:    Talks on phone: Not on file    Gets together: Not on file    Attends religious service: Not on file    Active member of club or organization: Not on file    Attends meetings of clubs or organizations: Not on file    Relationship status: Not on file  Other Topics Concern  . Not on file  Social History Narrative   Ms. Bozarth lives with her father & grandmother.    Denies abuse and feels safe at home.     Family History  Problem Relation Age of Onset  . Arthritis Father   . Hypertension Father   . Thyroid disease Father   . Diabetes Father   .  Depression Mother   . Thyroid disease Sister   . Healthy Maternal Grandmother   . Diabetes Maternal Grandfather   . Hypertension Maternal Grandfather   . COPD Maternal Grandfather   . Hypertension Paternal Grandmother   . Diabetes Paternal Grandfather     Review of Systems  Constitutional: Negative for chills, fatigue and fever.  Eyes: Negative for visual disturbance.  Respiratory: Negative for cough, shortness of breath and wheezing.   Cardiovascular: Negative for chest pain, palpitations and leg swelling.  Gastrointestinal: Negative for abdominal pain, blood in stool, constipation, diarrhea and nausea.       Gerd  Genitourinary: Negative for dysuria and hematuria.  Musculoskeletal: Positive for back pain.  Skin: Negative for color change and rash.  Neurological: Negative for light-headedness and headaches.   Psychiatric/Behavioral: Negative for dysphoric mood. The patient is not nervous/anxious.        Objective:   Vitals:   02/07/19 0747  BP: 118/84  Pulse: 77  Resp: 16  Temp: 98.4 F (36.9 C)  SpO2: 99%   Filed Weights   02/07/19 0747  Weight: 144 lb (65.3 kg)   Body mass index is 23.24 kg/m.  BP Readings from Last 3 Encounters:  02/07/19 118/84  12/23/18 100/70  02/04/18 118/80    Wt Readings from Last 3 Encounters:  02/07/19 144 lb (65.3 kg)  12/23/18 142 lb (64.4 kg)  02/04/18 151 lb (68.5 kg)     Physical Exam Constitutional: She appears well-developed and well-nourished. No distress.  HENT:  Head: Normocephalic and atraumatic.  Right Ear: External ear normal. Normal ear canal and TM Left Ear: External ear normal.  Normal ear canal and TM Mouth/Throat: Oropharynx is clear and moist.  Eyes: Conjunctivae and EOM are normal.  Neck: Neck supple. No tracheal deviation present. No thyromegaly present.  No carotid bruit  Cardiovascular: Normal rate, regular rhythm and normal heart sounds.   No murmur heard.  No edema. Pulmonary/Chest: Effort normal and breath sounds normal. No respiratory distress. She has no wheezes. She has no rales.  Breast: deferred to Gyn Abdominal: Soft. She exhibits no distension. There is no tenderness.  Lymphadenopathy: She has no cervical adenopathy.  Skin: Skin is warm and dry. She is not diaphoretic.  Psychiatric: She has a normal mood and affect. Her behavior is normal.        Assessment & Plan:   Physical exam: Screening blood work  ordered Immunizations   Up to [employer]   Up to date  Exercise  regular Weight  Normal BMI Skin  No skins Substance abuse  none  See Problem List for Assessment and Plan of chronic medical problems.   Follow up annually

## 2019-02-06 NOTE — Patient Instructions (Addendum)
Tests ordered today. Your results will be released to MyChart (or called to you) after review, usually within 72hours after test completion. If any changes need to be made, you will be notified at that same time.  All other Health Maintenance issues reviewed.   All recommended immunizations and age-appropriate screenings are up-to-date or discussed.  No immunizations administered today.   Medications reviewed and updated.  Changes include :  none    Please followup in one year   Health Maintenance, Female Adopting a healthy lifestyle and getting preventive care can go a long way to promote health and wellness. Talk with your health care provider about what schedule of regular examinations is right for you. This is a good chance for you to check in with your provider about disease prevention and staying healthy. In between checkups, there are plenty of things you can do on your own. Experts have done a lot of research about which lifestyle changes and preventive measures are most likely to keep you healthy. Ask your health care provider for more information. Weight and diet Eat a healthy diet  Be sure to include plenty of vegetables, fruits, low-fat dairy products, and lean protein.  Do not eat a lot of foods high in solid fats, added sugars, or salt.  Get regular exercise. This is one of the most important things you can do for your health. ? Most adults should exercise for at least 150 minutes each week. The exercise should increase your heart rate and make you sweat (moderate-intensity exercise). ? Most adults should also do strengthening exercises at least twice a week. This is in addition to the moderate-intensity exercise. Maintain a healthy weight  Body mass index (BMI) is a measurement that can be used to identify possible weight problems. It estimates body fat based on height and weight. Your health care provider can help determine your BMI and help you achieve or maintain a  healthy weight.  For females 20 years of age and older: ? A BMI below 18.5 is considered underweight. ? A BMI of 18.5 to 24.9 is normal. ? A BMI of 25 to 29.9 is considered overweight. ? A BMI of 30 and above is considered obese. Watch levels of cholesterol and blood lipids  You should start having your blood tested for lipids and cholesterol at 28 years of age, then have this test every 5 years.  You may need to have your cholesterol levels checked more often if: ? Your lipid or cholesterol levels are high. ? You are older than 28 years of age. ? You are at high risk for heart disease. Cancer screening Lung Cancer  Lung cancer screening is recommended for adults 55-80 years old who are at high risk for lung cancer because of a history of smoking.  A yearly low-dose CT scan of the lungs is recommended for people who: ? Currently smoke. ? Have quit within the past 15 years. ? Have at least a 30-pack-year history of smoking. A pack year is smoking an average of one pack of cigarettes a day for 1 year.  Yearly screening should continue until it has been 15 years since you quit.  Yearly screening should stop if you develop a health problem that would prevent you from having lung cancer treatment. Breast Cancer  Practice breast self-awareness. This means understanding how your breasts normally appear and feel.  It also means doing regular breast self-exams. Let your health care provider know about any changes, no matter how small.    If you are in your 20s or 30s, you should have a clinical breast exam (CBE) by a health care provider every 1-3 years as part of a regular health exam.  If you are 40 or older, have a CBE every year. Also consider having a breast X-ray (mammogram) every year.  If you have a family history of breast cancer, talk to your health care provider about genetic screening.  If you are at high risk for breast cancer, talk to your health care provider about having  an MRI and a mammogram every year.  Breast cancer gene (BRCA) assessment is recommended for women who have family members with BRCA-related cancers. BRCA-related cancers include: ? Breast. ? Ovarian. ? Tubal. ? Peritoneal cancers.  Results of the assessment will determine the need for genetic counseling and BRCA1 and BRCA2 testing. Cervical Cancer Your health care provider may recommend that you be screened regularly for cancer of the pelvic organs (ovaries, uterus, and vagina). This screening involves a pelvic examination, including checking for microscopic changes to the surface of your cervix (Pap test). You may be encouraged to have this screening done every 3 years, beginning at age 21.  For women ages 30-65, health care providers may recommend pelvic exams and Pap testing every 3 years, or they may recommend the Pap and pelvic exam, combined with testing for human papilloma virus (HPV), every 5 years. Some types of HPV increase your risk of cervical cancer. Testing for HPV may also be done on women of any age with unclear Pap test results.  Other health care providers may not recommend any screening for nonpregnant women who are considered low risk for pelvic cancer and who do not have symptoms. Ask your health care provider if a screening pelvic exam is right for you.  If you have had past treatment for cervical cancer or a condition that could lead to cancer, you need Pap tests and screening for cancer for at least 20 years after your treatment. If Pap tests have been discontinued, your risk factors (such as having a new sexual partner) need to be reassessed to determine if screening should resume. Some women have medical problems that increase the chance of getting cervical cancer. In these cases, your health care provider may recommend more frequent screening and Pap tests. Colorectal Cancer  This type of cancer can be detected and often prevented.  Routine colorectal cancer screening  usually begins at 28 years of age and continues through 28 years of age.  Your health care provider may recommend screening at an earlier age if you have risk factors for colon cancer.  Your health care provider may also recommend using home test kits to check for hidden blood in the stool.  A small camera at the end of a tube can be used to examine your colon directly (sigmoidoscopy or colonoscopy). This is done to check for the earliest forms of colorectal cancer.  Routine screening usually begins at age 50.  Direct examination of the colon should be repeated every 5-10 years through 28 years of age. However, you may need to be screened more often if early forms of precancerous polyps or small growths are found. Skin Cancer  Check your skin from head to toe regularly.  Tell your health care provider about any new moles or changes in moles, especially if there is a change in a mole's shape or color.  Also tell your health care provider if you have a mole that is larger than the   size of a pencil eraser.  Always use sunscreen. Apply sunscreen liberally and repeatedly throughout the day.  Protect yourself by wearing long sleeves, pants, a wide-brimmed hat, and sunglasses whenever you are outside. Heart disease, diabetes, and high blood pressure  High blood pressure causes heart disease and increases the risk of stroke. High blood pressure is more likely to develop in: ? People who have blood pressure in the high end of the normal range (130-139/85-89 mm Hg). ? People who are overweight or obese. ? People who are African American.  If you are 18-39 years of age, have your blood pressure checked every 3-5 years. If you are 40 years of age or older, have your blood pressure checked every year. You should have your blood pressure measured twice-once when you are at a hospital or clinic, and once when you are not at a hospital or clinic. Record the average of the two measurements. To check your  blood pressure when you are not at a hospital or clinic, you can use: ? An automated blood pressure machine at a pharmacy. ? A home blood pressure monitor.  If you are between 55 years and 79 years old, ask your health care provider if you should take aspirin to prevent strokes.  Have regular diabetes screenings. This involves taking a blood sample to check your fasting blood sugar level. ? If you are at a normal weight and have a low risk for diabetes, have this test once every three years after 28 years of age. ? If you are overweight and have a high risk for diabetes, consider being tested at a younger age or more often. Preventing infection Hepatitis B  If you have a higher risk for hepatitis B, you should be screened for this virus. You are considered at high risk for hepatitis B if: ? You were born in a country where hepatitis B is common. Ask your health care provider which countries are considered high risk. ? Your parents were born in a high-risk country, and you have not been immunized against hepatitis B (hepatitis B vaccine). ? You have HIV or AIDS. ? You use needles to inject street drugs. ? You live with someone who has hepatitis B. ? You have had sex with someone who has hepatitis B. ? You get hemodialysis treatment. ? You take certain medicines for conditions, including cancer, organ transplantation, and autoimmune conditions. Hepatitis C  Blood testing is recommended for: ? Everyone born from 1945 through 1965. ? Anyone with known risk factors for hepatitis C. Sexually transmitted infections (STIs)  You should be screened for sexually transmitted infections (STIs) including gonorrhea and chlamydia if: ? You are sexually active and are younger than 28 years of age. ? You are older than 28 years of age and your health care provider tells you that you are at risk for this type of infection. ? Your sexual activity has changed since you were last screened and you are at an  increased risk for chlamydia or gonorrhea. Ask your health care provider if you are at risk.  If you do not have HIV, but are at risk, it may be recommended that you take a prescription medicine daily to prevent HIV infection. This is called pre-exposure prophylaxis (PrEP). You are considered at risk if: ? You are sexually active and do not regularly use condoms or know the HIV status of your partner(s). ? You take drugs by injection. ? You are sexually active with a partner who has HIV.   has HIV. Talk with your health care provider about whether you are at high risk of being infected with HIV. If you choose to begin PrEP, you should first be tested for HIV. You should then be tested every 3 months for as long as you are taking PrEP. Pregnancy  If you are premenopausal and you may become pregnant, ask your health care provider about preconception counseling.  If you may become pregnant, take 400 to 800 micrograms (mcg) of folic acid every day.  If you want to prevent pregnancy, talk to your health care provider about birth control (contraception). Osteoporosis and menopause  Osteoporosis is a disease in which the bones lose minerals and strength with aging. This can result in serious bone fractures. Your risk for osteoporosis can be identified using a bone density scan.  If you are 24 years of age or older, or if you are at risk for osteoporosis and fractures, ask your health care provider if you should be screened.  Ask your health care provider whether you should take a calcium or vitamin D supplement to lower your risk for osteoporosis.  Menopause may have certain physical symptoms and risks.  Hormone replacement therapy may reduce some of these symptoms and risks. Talk to your health care provider about whether hormone replacement therapy is right for you. Follow these instructions at home:  Schedule regular health, dental, and eye exams.  Stay current with your immunizations.  Do not use  any tobacco products including cigarettes, chewing tobacco, or electronic cigarettes.  If you are pregnant, do not drink alcohol.  If you are breastfeeding, limit how much and how often you drink alcohol.  Limit alcohol intake to no more than 1 drink per day for nonpregnant women. One drink equals 12 ounces of beer, 5 ounces of wine, or 1 ounces of hard liquor.  Do not use street drugs.  Do not share needles.  Ask your health care provider for help if you need support or information about quitting drugs.  Tell your health care provider if you often feel depressed.  Tell your health care provider if you have ever been abused or do not feel safe at home. This information is not intended to replace advice given to you by your health care provider. Make sure you discuss any questions you have with your health care provider. Document Released: 05/05/2011 Document Revised: 03/27/2016 Document Reviewed: 07/24/2015 Elsevier Interactive Patient Education  2019 [employer].

## 2019-02-07 ENCOUNTER — Ambulatory Visit (INDEPENDENT_AMBULATORY_CARE_PROVIDER_SITE_OTHER): Payer: No Typology Code available for payment source | Admitting: Internal Medicine

## 2019-02-07 ENCOUNTER — Other Ambulatory Visit: Payer: Self-pay

## 2019-02-07 ENCOUNTER — Encounter: Payer: Self-pay | Admitting: Internal Medicine

## 2019-02-07 ENCOUNTER — Other Ambulatory Visit (INDEPENDENT_AMBULATORY_CARE_PROVIDER_SITE_OTHER): Payer: No Typology Code available for payment source

## 2019-02-07 VITALS — BP 118/84 | HR 77 | Temp 98.4°F | Resp 16 | Ht 66.0 in | Wt 144.0 lb

## 2019-02-07 DIAGNOSIS — Z0000 Encounter for general adult medical examination without abnormal findings: Secondary | ICD-10-CM

## 2019-02-07 DIAGNOSIS — E039 Hypothyroidism, unspecified: Secondary | ICD-10-CM

## 2019-02-07 LAB — CBC WITH DIFFERENTIAL/PLATELET
Basophils Absolute: 0.1 10*3/uL (ref 0.0–0.1)
Basophils Relative: 0.9 % (ref 0.0–3.0)
Eosinophils Absolute: 0.4 10*3/uL (ref 0.0–0.7)
Eosinophils Relative: 4.7 % (ref 0.0–5.0)
HCT: 39.4 % (ref 36.0–46.0)
Hemoglobin: 13.4 g/dL (ref 12.0–15.0)
Lymphocytes Relative: 34.9 % (ref 12.0–46.0)
Lymphs Abs: 2.9 10*3/uL (ref 0.7–4.0)
MCHC: 33.9 g/dL (ref 30.0–36.0)
MCV: 86.4 fl (ref 78.0–100.0)
Monocytes Absolute: 0.7 10*3/uL (ref 0.1–1.0)
Monocytes Relative: 8.7 % (ref 3.0–12.0)
Neutro Abs: 4.2 10*3/uL (ref 1.4–7.7)
Neutrophils Relative %: 50.8 % (ref 43.0–77.0)
Platelets: 268 10*3/uL (ref 150.0–400.0)
RBC: 4.56 Mil/uL (ref 3.87–5.11)
RDW: 12.7 % (ref 11.5–15.5)
WBC: 8.3 10*3/uL (ref 4.0–10.5)

## 2019-02-07 LAB — COMPREHENSIVE METABOLIC PANEL
ALT: 8 U/L (ref 0–35)
AST: 12 U/L (ref 0–37)
Albumin: 4 g/dL (ref 3.5–5.2)
Alkaline Phosphatase: 47 U/L (ref 39–117)
BUN: 15 mg/dL (ref 6–23)
CO2: 27 mEq/L (ref 19–32)
Calcium: 9.5 mg/dL (ref 8.4–10.5)
Chloride: 106 mEq/L (ref 96–112)
Creatinine, Ser: 0.78 mg/dL (ref 0.40–1.20)
GFR: 87.9 mL/min (ref >60.00)
Glucose, Bld: 85 mg/dL (ref 70–99)
Potassium: 3.7 mEq/L (ref 3.5–5.1)
Sodium: 141 mEq/L (ref 135–145)
Total Bilirubin: 0.5 mg/dL (ref 0.2–1.2)
Total Protein: 7 g/dL (ref 6.0–8.3)

## 2019-02-07 LAB — LIPID PANEL
Cholesterol: 194 mg/dL (ref 0–200)
HDL: 71.2 mg/dL (ref >39.00)
LDL Cholesterol: 108 mg/dL — ABNORMAL HIGH (ref 0–99)
NonHDL: 122.78
Total CHOL/HDL Ratio: 3
Triglycerides: 73 mg/dL (ref 0.0–149.0)
VLDL: 14.6 mg/dL (ref 0.0–40.0)

## 2019-02-07 LAB — TSH: TSH: 1.28 u[IU]/mL (ref 0.35–4.50)

## 2019-02-07 NOTE — Assessment & Plan Note (Signed)
Clinically euthyroid Check tsh  Titrate med dose if needed  

## 2019-03-02 ENCOUNTER — Ambulatory Visit (INDEPENDENT_AMBULATORY_CARE_PROVIDER_SITE_OTHER): Payer: No Typology Code available for payment source | Admitting: Psychology

## 2019-03-02 DIAGNOSIS — Z634 Disappearance and death of family member: Secondary | ICD-10-CM | POA: Diagnosis not present

## 2019-03-02 DIAGNOSIS — F329 Major depressive disorder, single episode, unspecified: Secondary | ICD-10-CM

## 2019-03-18 MED FILL — DROSPIREN-ETH ESTRAD-LEVOME: 3-0.02-0.45 | 70 days supply | Qty: 84 | Fill #1

## 2019-03-23 ENCOUNTER — Other Ambulatory Visit: Payer: Self-pay

## 2019-03-23 ENCOUNTER — Ambulatory Visit (INDEPENDENT_AMBULATORY_CARE_PROVIDER_SITE_OTHER): Payer: No Typology Code available for payment source | Admitting: Psychology

## 2019-03-23 DIAGNOSIS — F329 Major depressive disorder, single episode, unspecified: Secondary | ICD-10-CM | POA: Diagnosis not present

## 2019-03-23 DIAGNOSIS — Z111 Encounter for screening for respiratory tuberculosis: Secondary | ICD-10-CM

## 2019-03-23 DIAGNOSIS — Z634 Disappearance and death of family member: Secondary | ICD-10-CM | POA: Diagnosis not present

## 2019-03-24 ENCOUNTER — Other Ambulatory Visit: Payer: No Typology Code available for payment source

## 2019-03-24 ENCOUNTER — Other Ambulatory Visit: Payer: Self-pay | Admitting: Internal Medicine

## 2019-03-24 DIAGNOSIS — Z111 Encounter for screening for respiratory tuberculosis: Secondary | ICD-10-CM

## 2019-03-24 MED ORDER — ADAPALENE 0.3 % EX GEL: CUTANEOUS | 5 refills | Status: AC

## 2019-03-24 MED ORDER — CLINDAMYCIN PHOSPHATE 1 % EX SOLN: Freq: Two times a day (BID) | CUTANEOUS | 5 refills | Status: AC

## 2019-03-24 MED FILL — CLINDAMYCIN PH 1% SOLUTION: 1 | 30 days supply | Qty: 30 | Fill #0

## 2019-03-29 LAB — QUANTIFERON-TB GOLD PLUS
Mitogen-NIL: 8.87 IU/mL
NIL: 0.03 IU/mL
QuantiFERON-TB Gold Plus: NEGATIVE
TB1-NIL: 0.03 IU/mL
TB2-NIL: 0.01 IU/mL

## 2019-04-13 MED FILL — LEVOTHYROXINE 75 MCG TABLET: 75 | 88 days supply | Qty: 114 | Fill #1

## 2019-05-03 ENCOUNTER — Other Ambulatory Visit: Payer: Self-pay

## 2019-05-03 ENCOUNTER — Ambulatory Visit (INDEPENDENT_AMBULATORY_CARE_PROVIDER_SITE_OTHER): Payer: No Typology Code available for payment source | Admitting: Family Medicine

## 2019-05-03 ENCOUNTER — Encounter: Payer: Self-pay | Admitting: Family Medicine

## 2019-05-03 DIAGNOSIS — M62830 Muscle spasm of back: Secondary | ICD-10-CM | POA: Diagnosis not present

## 2019-05-03 MED ORDER — TIZANIDINE HCL 4 MG PO TABS: 4.0000 mg | ORAL_TABLET | Freq: Every day | ORAL | 0 refills | Status: DC

## 2019-05-03 MED ORDER — MELOXICAM 15 MG PO TABS: 15.0000 mg | ORAL_TABLET | Freq: Every day | ORAL | 0 refills | Status: DC

## 2019-05-03 MED FILL — MELOXICAM 15 MG TABLET: 15 | 30 days supply | Qty: 30 | Fill #0

## 2019-05-03 MED FILL — tiZANidine HCL 4 MG TABS: 4 | 30 days supply | Qty: 30 | Fill #0

## 2019-05-03 NOTE — Progress Notes (Signed)
[name] [address], [place] [address] Phone: [phone] Subjective:     CC: Low back pain  CBJ:[id number]  Patricia [name] is a 28 y.o. female coming in with complaint of low back pain.  More left side.  Tightness.  Has been trying to walk on a more regular basis.  Is been going to school and was sitting more at work as well.  No radiation down the legs.  Describes pain as a dull, throbbing aching sensation.     Past Medical History:  Diagnosis Date  . Chicken pox   . Thyroid disease    Past Surgical History:  Procedure Laterality Date  . MOHS SURGERY  2017   nose  . WISDOM TOOTH EXTRACTION     Social History   Socioeconomic History  . Marital status: Single    Spouse name: Not on file  . Number of children: 0  . Years of education: 87  . Highest education level: Not on file  Occupational History  . Occupation: CMA    Employer: [employer]  . Financial resource strain: Not on file  . Food insecurity    Worry: Not on file    Inability: Not on file  . Transportation needs    Medical: Not on file    Non-medical: Not on file  Tobacco Use  . Smoking status: Never Smoker  . Smokeless tobacco: Never Used  Substance and Sexual Activity  . Alcohol use: Yes    Alcohol/week: 3.0 standard drinks    Types: 1 Glasses of wine, 1 Cans of beer, 1 Shots of liquor per week  . Drug use: No  . Sexual activity: Yes    Birth control/protection: Pill  Lifestyle  . Physical activity    Days per week: Not on file    Minutes per session: Not on file  . Stress: Not on file  Relationships  . Social Herbalist on phone: Not on file    Gets together: Not on file    Attends religious service: Not on file    Active member of club or organization: Not on file    Attends meetings of clubs or organizations: Not on file    Relationship status: Not on file  Other Topics Concern  . Not on file  Social  History Narrative   Ms. Anstine lives with her father & grandmother.    Denies abuse and feels safe at home.    Allergies  Allergen Reactions  . Amoxicillin Nausea And Vomiting   Family History  Problem Relation Age of Onset  . Arthritis Father   . Hypertension Father   . Thyroid disease Father   . Diabetes Father   . Depression Mother   . Thyroid disease Sister   . Healthy Maternal Grandmother   . Diabetes Maternal Grandfather   . Hypertension Maternal Grandfather   . COPD Maternal Grandfather   . Hypertension Paternal Grandmother   . Diabetes Paternal Grandfather     Current Outpatient Medications (Endocrine & Metabolic):  .  BEYAZ 3-0.02-0.451 MG tablet,  .  levothyroxine (SYNTHROID, LEVOTHROID) 75 MCG tablet, TAKE 1 TABLET BY MOUTH ONCE DAILY BEFORE BREAKFAST, EXCEPT FOR TWICE A WEEK TAKE 2 TABLETS    Current Outpatient Medications (Analgesics):  .  meloxicam (MOBIC) 15 MG tablet, Take 1 tablet (15 mg total) by mouth daily. .  meloxicam (MOBIC) 15 MG tablet, Take 1 tablet (15 mg  total) by mouth daily.   Current Outpatient Medications (Other):  Marland Kitchen  Adapalene 0.3 % gel, Apply topically to face at nigh .  clindamycin (CLEOCIN T) 1 % external solution, Apply topically 2 (two) times daily. Marland Kitchen  tiZANidine (ZANAFLEX) 4 MG tablet, Take 1 tablet (4 mg total) by mouth at bedtime.    Past medical history, social, surgical and family history all reviewed in electronic medical record.  No pertanent information unless stated regarding to the chief complaint.   Review of Systems:  No headache, visual changes, nausea, vomiting, diarrhea, constipation, dizziness, abdominal pain, skin rash, fevers, chills, night sweats, weight loss, swollen lymph nodes, body aches, joint swelling,  chest pain, shortness of breath, mood changes.  Positive muscle aches  Objective     General: No apparent distress alert and oriented x3 mood and affect normal, dressed appropriately.  HEENT: Pupils  equal, extraocular movements intact  Respiratory: Patient's speak in full sentences and does not appear short of breath  Cardiovascular: No lower extremity edema, non tender, no erythema  Skin: Warm dry intact with no signs of infection or rash on extremities or on axial skeleton.  Abdomen: Soft nontender  Neuro: Cranial nerves II through XII are intact, neurovascularly intact in all extremities with 2+ DTRs and 2+ pulses.  Lymph: No lymphadenopathy of posterior or anterior cervical chain or axillae bilaterally.  Gait normal with good balance and coordination.  MSK:  Non tender with full range of motion and good stability and symmetric strength and tone of shoulders, elbows, wrist, hip, knee and ankles bilaterally.  Back Exam:  Inspection: loss of lordosis  Motion: Flexion 45 deg, Extension 25 deg, Side Bending to 45 deg bilaterally,  Rotation to 45 deg bilaterally  SLR laying: Negative  XSLR laying: Negative  Palpable tenderness: Tender to palpation in the paraspinal musculature lumbar spine L4-L5 left side only. FABER: Positive left. Sensory change: Gross sensation intact to all lumbar and sacral dermatomes.  Reflexes: 2+ at both patellar tendons, 2+ at achilles tendons, Babinski's downgoing.  Strength at foot  Plantar-flexion: 5/5 Dorsi-flexion: 5/5 Eversion: 5/5 Inversion: 5/5  Leg strength  Quad: 5/5 Hamstring: 5/5 Hip flexor: 5/5 Hip abductors: 5/5  Gait unremarkable.   Impression and Recommendations:     This case required medical decision making of moderate complexity. The above documentation has been reviewed and is accurate and complete Lyndal Pulley, DO       Note: This dictation was prepared with Dragon dictation along with smaller phrase technology. Any transcriptional errors that result from this process are unintentional.

## 2019-05-03 NOTE — Assessment & Plan Note (Signed)
Spasm noted.  Seems to be more left-sided.  Hip flexor.  Given exercises.  Meloxicam and muscle relaxer at night.  Follow-up again in 4 to 6 weeks

## 2019-05-25 ENCOUNTER — Ambulatory Visit: Payer: No Typology Code available for payment source | Admitting: Psychology

## 2019-05-26 MED FILL — DROSPIREN-ETH ESTRAD-LEVOME: 3-0.02-0.45 | 70 days supply | Qty: 84 | Fill #2

## 2019-05-30 ENCOUNTER — Ambulatory Visit (INDEPENDENT_AMBULATORY_CARE_PROVIDER_SITE_OTHER): Payer: No Typology Code available for payment source | Admitting: Psychology

## 2019-05-30 DIAGNOSIS — F329 Major depressive disorder, single episode, unspecified: Secondary | ICD-10-CM

## 2019-05-30 DIAGNOSIS — Z634 Disappearance and death of family member: Secondary | ICD-10-CM | POA: Diagnosis not present

## 2019-06-27 ENCOUNTER — Encounter: Payer: Self-pay | Admitting: Family Medicine

## 2019-06-27 ENCOUNTER — Ambulatory Visit (INDEPENDENT_AMBULATORY_CARE_PROVIDER_SITE_OTHER)
Admission: RE | Admit: 2019-06-27 | Discharge: 2019-06-27 | Disposition: A | Discharge status: Home / Self Care | Payer: No Typology Code available for payment source | Source: Ambulatory Visit | Attending: Family Medicine | Admitting: Family Medicine

## 2019-06-27 ENCOUNTER — Ambulatory Visit: Payer: Self-pay

## 2019-06-27 ENCOUNTER — Other Ambulatory Visit: Payer: Self-pay

## 2019-06-27 ENCOUNTER — Ambulatory Visit (INDEPENDENT_AMBULATORY_CARE_PROVIDER_SITE_OTHER): Payer: No Typology Code available for payment source | Admitting: Family Medicine

## 2019-06-27 DIAGNOSIS — M25571 Pain in right ankle and joints of right foot: Secondary | ICD-10-CM | POA: Diagnosis not present

## 2019-06-27 DIAGNOSIS — S82891A Other fracture of right lower leg, initial encounter for closed fracture: Secondary | ICD-10-CM | POA: Diagnosis not present

## 2019-06-27 MED ORDER — KETOROLAC TROMETHAMINE 60 MG/2ML IM SOLN
60.0000 mg | Freq: Once | INTRAMUSCULAR | Status: AC
  Administered 2019-06-27: 60 mg via INTRAMUSCULAR

## 2019-06-27 MED ORDER — VITAMIN D (ERGOCALCIFEROL) 1.25 MG (50000 UNIT) PO CAPS: 50000.0000 [IU] | ORAL_CAPSULE | ORAL | 0 refills | Status: DC

## 2019-06-27 MED FILL — VIT D2 1.25 MG (50,000 UNIT: 1.25 MG | 84 days supply | Qty: 12 | Fill #0

## 2019-06-27 NOTE — Progress Notes (Signed)
[name] [address], [place] [address] Phone: [phone] Subjective:   Fontaine No, am serving as a scribe for Dr. Hulan Saas.  I'm seeing this patient by the request  of:    CC: Right ankle pain  [id number]  Patricia [name] is a 28 y.o. female coming in with complaint of right ankle pain.  The patient and unfortunately was in a river and felt her ankle rolled between 2 rocks.  Had pain and swelling immediately.  Unable to bear weight initially.  Now able to do somewhat.  Patient still rates the severity of pain is 7 out of 10, continuing to have swelling, unable to wear shoes secondary to the discomfort and pain.      Past Medical History:  Diagnosis Date  . Chicken pox   . Thyroid disease    Past Surgical History:  Procedure Laterality Date  . MOHS SURGERY  2017   nose  . WISDOM TOOTH EXTRACTION     Social History   Socioeconomic History  . Marital status: Single    Spouse name: Not on file  . Number of children: 0  . Years of education: 46  . Highest education level: Not on file  Occupational History  . Occupation: CMA    Employer: [employer]  . Financial resource strain: Not on file  . Food insecurity    Worry: Not on file    Inability: Not on file  . Transportation needs    Medical: Not on file    Non-medical: Not on file  Tobacco Use  . Smoking status: Never Smoker  . Smokeless tobacco: Never Used  Substance and Sexual Activity  . Alcohol use: Yes    Alcohol/week: 3.0 standard drinks    Types: 1 Glasses of wine, 1 Cans of beer, 1 Shots of liquor per week  . Drug use: No  . Sexual activity: Yes    Birth control/protection: Pill  Lifestyle  . Physical activity    Days per week: Not on file    Minutes per session: Not on file  . Stress: Not on file  Relationships  . Social Herbalist on phone: Not on file    Gets together: Not on file    Attends  religious service: Not on file    Active member of club or organization: Not on file    Attends meetings of clubs or organizations: Not on file    Relationship status: Not on file  Other Topics Concern  . Not on file  Social History Narrative   Ms. Mooring lives with her father & grandmother.    Denies abuse and feels safe at home.    Allergies  Allergen Reactions  . Amoxicillin Nausea And Vomiting   Family History  Problem Relation Age of Onset  . Arthritis Father   . Hypertension Father   . Thyroid disease Father   . Diabetes Father   . Depression Mother   . Thyroid disease Sister   . Healthy Maternal Grandmother   . Diabetes Maternal Grandfather   . Hypertension Maternal Grandfather   . COPD Maternal Grandfather   . Hypertension Paternal Grandmother   . Diabetes Paternal Grandfather     Current Outpatient Medications (Endocrine & Metabolic):  .  BEYAZ 3-0.02-0.451 MG tablet,  .  levothyroxine (SYNTHROID, LEVOTHROID) 75 MCG tablet, TAKE 1 TABLET BY MOUTH ONCE DAILY BEFORE BREAKFAST, EXCEPT FOR TWICE A  WEEK TAKE 2 TABLETS    Current Outpatient Medications (Analgesics):  .  meloxicam (MOBIC) 15 MG tablet, Take 1 tablet (15 mg total) by mouth daily. .  meloxicam (MOBIC) 15 MG tablet, Take 1 tablet (15 mg total) by mouth daily.   Current Outpatient Medications (Other):  Marland Kitchen  Adapalene 0.3 % gel, Apply topically to face at nigh .  clindamycin (CLEOCIN T) 1 % external solution, Apply topically 2 (two) times daily. Marland Kitchen  tiZANidine (ZANAFLEX) 4 MG tablet, Take 1 tablet (4 mg total) by mouth at bedtime.    Past medical history, social, surgical and family history all reviewed in electronic medical record.  No pertanent information unless stated regarding to the chief complaint.   Review of Systems:  No headache, visual changes, nausea, vomiting, diarrhea, constipation, dizziness, abdominal pain, skin rash, fevers, chills, night sweats, weight loss, swollen lymph nodes, body  aches, joint swelling, muscle aches, chest pain, shortness of breath, mood changes.   Objective  Last menstrual period 06/21/2019. Systems examined below as of    General: No apparent distress alert and oriented x3 mood and affect normal, dressed appropriately.  HEENT: Pupils equal, extraocular movements intact  Respiratory: Patient's speak in full sentences and does not appear short of breath  Cardiovascular: No lower extremity edema, non tender, no erythema  Skin: Warm dry intact with no signs of infection or rash on extremities or on axial skeleton.  Abdomen: Soft nontender  Neuro: Cranial nerves II through XII are intact, neurovascularly intact in all extremities with 2+ DTRs and 2+ pulses.  Lymph: No lymphadenopathy of posterior or anterior cervical chain or axillae bilaterally.  Gait severely antalgic with very minimal weightbearing on the right side.  Right ankle exam does have a significant amount of swelling noted.  Patient does have bruising on the medial aspect of the ankle as well.  Severely tender to palpation over the lateral malleolus only mild over the talar dome.  No pain over the medial malleolus.  Patient does have some effusion noted.  Limited musculoskeletal ultrasound was performed and interpreted by Lyndal Pulley  Limited ultrasound showed the patient does have what appears to be a cortical defect of the lateral malleolus with an avulsion noted.  There does seem to be displacement.  1 cm in length. To 2 mm displaced.  Hypoechoic changes noted as well throughout the area.  Trace ankle effusion noted. Impression: Lateral malleolus fracture   Impression and Recommendations:     This case required medical decision making of moderate complexity. The above documentation has been reviewed and is accurate and complete Lyndal Pulley, DO       Note: This dictation was prepared with Dragon dictation along with smaller phrase technology. Any transcriptional errors that  result from this process are unintentional.

## 2019-06-27 NOTE — Assessment & Plan Note (Signed)
Lateral malleolus fracture with patient having a very mild displacement noted. Given the patient can do normal with conservative therapy.  Patient we discussed cam walker at this moment, weight-bear as tolerated, vitamin D, meloxicam for breakthrough pain, discussed icing regimen, Toradol given today for pain relief.  If necessary we will give patient crutches.  Follow-up again in 2 to 3 weeks to make sure healing appropriately.

## 2019-06-27 NOTE — Addendum Note (Signed)
Addended by: Douglass Rivers T on: 06/27/2019 03:59 PM   Modules accepted: Orders

## 2019-07-06 ENCOUNTER — Other Ambulatory Visit: Payer: Self-pay | Admitting: Internal Medicine

## 2019-07-06 DIAGNOSIS — E063 Autoimmune thyroiditis: Secondary | ICD-10-CM

## 2019-07-06 DIAGNOSIS — E038 Other specified hypothyroidism: Secondary | ICD-10-CM

## 2019-07-06 MED FILL — LEVOTHYROXINE 75 MCG TABLET: 75 | 84 days supply | Qty: 108 | Fill #0

## 2019-07-19 ENCOUNTER — Telehealth (INDEPENDENT_AMBULATORY_CARE_PROVIDER_SITE_OTHER): Payer: No Typology Code available for payment source

## 2019-07-19 DIAGNOSIS — Z23 Encounter for immunization: Secondary | ICD-10-CM | POA: Diagnosis not present

## 2019-07-19 NOTE — Telephone Encounter (Signed)
Flu shot given

## 2019-07-26 ENCOUNTER — Ambulatory Visit: Payer: Self-pay

## 2019-07-26 ENCOUNTER — Ambulatory Visit (INDEPENDENT_AMBULATORY_CARE_PROVIDER_SITE_OTHER): Payer: No Typology Code available for payment source | Admitting: Family Medicine

## 2019-07-26 ENCOUNTER — Other Ambulatory Visit: Payer: Self-pay

## 2019-07-26 VITALS — Ht 66.0 in | Wt 144.0 lb

## 2019-07-26 DIAGNOSIS — G8929 Other chronic pain: Secondary | ICD-10-CM

## 2019-07-26 DIAGNOSIS — S82891G Other fracture of right lower leg, subsequent encounter for closed fracture with delayed healing: Secondary | ICD-10-CM

## 2019-07-26 DIAGNOSIS — M25571 Pain in right ankle and joints of right foot: Secondary | ICD-10-CM | POA: Diagnosis not present

## 2019-07-26 NOTE — Progress Notes (Signed)
[name] [place], [place] [address] Phone: [phone] Subjective:    I'm seeing this patient by the request  of:    CC: Foot pain follow-up  [id number]  Patricia [name] is a 28 y.o. female coming in with complaint of foot pain follow-up.  Patient was seen previously and had a ankle fracture and.  Right side.  Has not been able to wear the cam walker at work.  Has been using a different brace.  Unfortunately has significant amount of dull, throbbing pain at the end of a long day.  Otherwise wears a Cam walker when she can.  Feels like she is making improvement but very slowly.     Past Medical History:  Diagnosis Date  . Chicken pox   . Thyroid disease    Past Surgical History:  Procedure Laterality Date  . MOHS SURGERY  2017   nose  . WISDOM TOOTH EXTRACTION     Social History   Socioeconomic History  . Marital status: Single    Spouse name: Not on file  . Number of children: 0  . Years of education: 21  . Highest education level: Not on file  Occupational History  . Occupation: CMA    Employer: [employer]  . Financial resource strain: Not on file  . Food insecurity    Worry: Not on file    Inability: Not on file  . Transportation needs    Medical: Not on file    Non-medical: Not on file  Tobacco Use  . Smoking status: Never Smoker  . Smokeless tobacco: Never Used  Substance and Sexual Activity  . Alcohol use: Yes    Alcohol/week: 3.0 standard drinks    Types: 1 Glasses of wine, 1 Cans of beer, 1 Shots of liquor per week  . Drug use: No  . Sexual activity: Yes    Birth control/protection: Pill  Lifestyle  . Physical activity    Days per week: Not on file    Minutes per session: Not on file  . Stress: Not on file  Relationships  . Social Herbalist on phone: Not on file    Gets together: Not on file    Attends religious service: Not on file    Active member of  club or organization: Not on file    Attends meetings of clubs or organizations: Not on file    Relationship status: Not on file  Other Topics Concern  . Not on file  Social History Narrative   Ms. Utterback lives with her father & grandmother.    Denies abuse and feels safe at home.    Allergies  Allergen Reactions  . Amoxicillin Nausea And Vomiting   Family History  Problem Relation Age of Onset  . Arthritis Father   . Hypertension Father   . Thyroid disease Father   . Diabetes Father   . Depression Mother   . Thyroid disease Sister   . Healthy Maternal Grandmother   . Diabetes Maternal Grandfather   . Hypertension Maternal Grandfather   . COPD Maternal Grandfather   . Hypertension Paternal Grandmother   . Diabetes Paternal Grandfather     Current Outpatient Medications (Endocrine & Metabolic):  .  BEYAZ 3-0.02-0.451 MG tablet,  .  levothyroxine (SYNTHROID) 75 MCG tablet, TAKE 1 TABLET BY MOUTH ONCE A DAY BEFORE BREAKFAST EXCEPT FOR TWICE A WEEK TAKE 2 TABLETS  Current Outpatient Medications (Analgesics):  .  meloxicam (MOBIC) 15 MG tablet, Take 1 tablet (15 mg total) by mouth daily. .  meloxicam (MOBIC) 15 MG tablet, Take 1 tablet (15 mg total) by mouth daily.   Current Outpatient Medications (Other):  Marland Kitchen  Adapalene 0.3 % gel, Apply topically to face at nigh .  clindamycin (CLEOCIN T) 1 % external solution, Apply topically 2 (two) times daily. Marland Kitchen  tiZANidine (ZANAFLEX) 4 MG tablet, Take 1 tablet (4 mg total) by mouth at bedtime. .  Vitamin D, Ergocalciferol, (DRISDOL) 1.25 MG (50000 UT) CAPS capsule, Take 1 capsule (50,000 Units total) by mouth every 7 (seven) days.    Past medical history, social, surgical and family history all reviewed in electronic medical record.  No pertanent information unless stated regarding to the chief complaint.   Review of Systems:  No headache, visual changes, nausea, vomiting, diarrhea, constipation, dizziness, abdominal pain, skin  rash, fevers, chills, night sweats, weight loss, swollen lymph nodes, body aches, joint swelling, muscle aches, chest pain, shortness of breath, mood changes.   Objective  Height 5\' 6"  (1.676 m), weight 144 lb (65.3 kg).    General: No apparent distress alert and oriented x3 mood and affect normal, dressed appropriately.  HEENT: Pupils equal, extraocular movements intact  Respiratory: Patient's speak in full sentences and does not appear short of breath  Cardiovascular: No lower extremity edema, non tender, no erythema  Skin: Warm dry intact with no signs of infection or rash on extremities or on axial skeleton.  Abdomen: Soft nontender  Neuro: Cranial nerves II through XII are intact, neurovascularly intact in all extremities with 2+ DTRs and 2+ pulses.  Lymph: No lymphadenopathy of posterior or anterior cervical chain or axillae bilaterally.  Gait normal with good balance and coordination.  MSK:  Non tender with full range of motion and good stability and symmetric strength and tone of shoulders, elbows, wrist, hip, knee bilaterally.  Right ankle exam has significant decrease in swelling from previous exam.  Still tender over the lateral malleolus area.  Patient still has positive anterior drawer test.  Achilles intact.  No pain over the medial aspect of the ankle.  Limited musculoskeletal ultrasound was performed and interpreted by Lyndal Pulley  Limited ultrasound of patient's right ankle she is a patient does have a callus formation occurring over the distal malleolus but not fully at this point.  Still is soft callus noted as well.  Effusion of the joint seems to be improved.   Impression and Recommendations:     This case required medical decision making of moderate complexity. The above documentation has been reviewed and is accurate and complete Lyndal Pulley, DO       Note: This dictation was prepared with Dragon dictation along with smaller phrase technology. Any  transcriptional errors that result from this process are unintentional.

## 2019-07-26 NOTE — Patient Instructions (Signed)
Good to see you Continue the vitamin d and k2  See me again in 3 weeks

## 2019-07-27 ENCOUNTER — Encounter: Payer: Self-pay | Admitting: Family Medicine

## 2019-07-27 NOTE — Assessment & Plan Note (Signed)
Improvement noted.  Discussed posture and ergonomics, discussed which activities to do which wants to avoid.  Patient is to increase activity slowly.  Transitioning out of the cam walker in the next 10 days.  Range of motion exercises given.  Follow-up 1 more time in 3 to 4 weeks to hopefully release to do more aggressive therapy and/or exercise.

## 2019-08-11 MED FILL — DROSPIREN-ETH ESTRAD-LEVOME: 3-0.02-0.45 | 70 days supply | Qty: 84 | Fill #3

## 2019-09-18 NOTE — Progress Notes (Signed)
[name] [address], [place] [address] Phone: [phone] Subjective:   Fontaine No, am serving as a scribe for Dr. Hulan Saas.   CC: Ankle pain follow-up  [id number]   07/26/2019 Improvement noted.  Discussed posture and ergonomics, discussed which activities to do which wants to avoid.  Patient is to increase activity slowly.  Transitioning out of the cam walker in the next 10 days.  Range of motion exercises given.  Follow-up 1 more time in 3 to 4 weeks to hopefully release to do more aggressive therapy and/or exercise.  Update 09/19/2019 Patricia [name] is a 28 y.o. female coming in with complaint of right foot pain.  Patient has had an avulsion fracture of the ankle noted.  Still has unfortunately intermittent swelling.  Patient has been fairly noncompliant with the boot, brace and continues to do such things as hunting.  Patient states that recently he did go hunting and did have significant amount of swelling afterwards.  Patient though denies any new symptoms just does not seem to ever improve completely.     Past Medical History:  Diagnosis Date  . Chicken pox   . Thyroid disease    Past Surgical History:  Procedure Laterality Date  . MOHS SURGERY  2017   nose  . WISDOM TOOTH EXTRACTION     Social History   Socioeconomic History  . Marital status: Single    Spouse name: Not on file  . Number of children: 0  . Years of education: 59  . Highest education level: Not on file  Occupational History  . Occupation: CMA    Employer: [employer]  . Financial resource strain: Not on file  . Food insecurity    Worry: Not on file    Inability: Not on file  . Transportation needs    Medical: Not on file    Non-medical: Not on file  Tobacco Use  . Smoking status: Never Smoker  . Smokeless tobacco: Never Used  Substance and Sexual Activity  . Alcohol use: Yes    Alcohol/week: 3.0  standard drinks    Types: 1 Glasses of wine, 1 Cans of beer, 1 Shots of liquor per week  . Drug use: No  . Sexual activity: Yes    Birth control/protection: Pill  Lifestyle  . Physical activity    Days per week: Not on file    Minutes per session: Not on file  . Stress: Not on file  Relationships  . Social Herbalist on phone: Not on file    Gets together: Not on file    Attends religious service: Not on file    Active member of club or organization: Not on file    Attends meetings of clubs or organizations: Not on file    Relationship status: Not on file  Other Topics Concern  . Not on file  Social History Narrative   Patricia [name] lives with her father & grandmother.    Denies abuse and feels safe at home.    Allergies  Allergen Reactions  . Amoxicillin Nausea And Vomiting   Family History  Problem Relation Age of Onset  . Arthritis Father   . Hypertension Father   . Thyroid disease Father   . Diabetes Father   . Depression Mother   . Thyroid disease Sister   . Healthy Maternal Grandmother   . Diabetes Maternal Grandfather   . Hypertension  Maternal Grandfather   . COPD Maternal Grandfather   . Hypertension Paternal Grandmother   . Diabetes Paternal Grandfather     Current Outpatient Medications (Endocrine & Metabolic):  .  BEYAZ 3-0.02-0.451 MG tablet,  .  levothyroxine (SYNTHROID) 75 MCG tablet, TAKE 1 TABLET BY MOUTH ONCE A DAY BEFORE BREAKFAST EXCEPT FOR TWICE A WEEK TAKE 2 TABLETS  Current Outpatient Medications (Cardiovascular):  .  nitroGLYCERIN (NITRO-DUR) 0.1 mg/hr patch, 1/4 patch daily   Current Outpatient Medications (Analgesics):  .  meloxicam (MOBIC) 15 MG tablet, Take 1 tablet (15 mg total) by mouth daily. .  meloxicam (MOBIC) 15 MG tablet, Take 1 tablet (15 mg total) by mouth daily.   Current Outpatient Medications (Other):  Marland Kitchen  Adapalene 0.3 % gel, Apply topically to face at nigh .  clindamycin (CLEOCIN T) 1 % external solution,  Apply topically 2 (two) times daily. Marland Kitchen  tiZANidine (ZANAFLEX) 4 MG tablet, Take 1 tablet (4 mg total) by mouth at bedtime. .  Vitamin D, Ergocalciferol, (DRISDOL) 1.25 MG (50000 UT) CAPS capsule, Take 1 capsule (50,000 Units total) by mouth every 7 (seven) days. .  Vitamin D, Ergocalciferol, (DRISDOL) 1.25 MG (50000 UT) CAPS capsule, Take 1 capsule (50,000 Units total) by mouth every 7 (seven) days.    Past medical history, social, surgical and family history all reviewed in electronic medical record.  No pertanent information unless stated regarding to the chief complaint.   Review of Systems:  No headache, visual changes, nausea, vomiting, diarrhea, constipation, dizziness, abdominal pain, skin rash, fevers, chills, night sweats, weight loss, swollen lymph nodes, body aches, joint swelling,chest pain, shortness of breath, mood changes.  Positive muscle aches  Objective  Height 5\' 6"  (1.676 m), weight 144 lb (65.3 kg).    General: No apparent distress alert and oriented x3 mood and affect normal, dressed appropriately.  HEENT: Pupils equal, extraocular movements intact  Respiratory: Patient's speak in full sentences and does not appear short of breath  Cardiovascular: No lower extremity edema, non tender, no erythema  Skin: Warm dry intact with no signs of infection or rash on extremities or on axial skeleton.  Abdomen: Soft nontender  Neuro: Cranial nerves II through XII are intact, neurovascularly intact in all extremities with 2+ DTRs and 2+ pulses.  Lymph: No lymphadenopathy of posterior or anterior cervical chain or axillae bilaterally.  Gait mild antalgic MSK:  tender with full range of motion and good stability and symmetric strength and tone of shoulders, elbows, wrist, hip, knee bilaterally.  Right ankle exam has trace effusion compared to the contralateral side.  Patient does have some mild limited range of motion lacking last 5 degrees of plantarflexion in the last 5 degrees of  dorsiflexion.  Still tender to palpation over the lateral malleolus no pain over the talar dome.  Limited musculoskeletal ultrasound was performed and interpreted by Lyndal Pulley  Limited ultrasound of patient's ankle shows still cortical irregularity of the lateral malleolus noted.  Callus formation is not full at this moment. Impression: Mild healing to delayed healing lateral malleolus fracture   Impression and Recommendations:     This case required medical decision making of moderate complexity. The above documentation has been reviewed and is accurate and complete Lyndal Pulley, DO       Note: This dictation was prepared with Dragon dictation along with smaller phrase technology. Any transcriptional errors that result from this process are unintentional.

## 2019-09-19 ENCOUNTER — Ambulatory Visit (INDEPENDENT_AMBULATORY_CARE_PROVIDER_SITE_OTHER): Payer: No Typology Code available for payment source | Admitting: Family Medicine

## 2019-09-19 ENCOUNTER — Other Ambulatory Visit: Payer: Self-pay

## 2019-09-19 ENCOUNTER — Encounter: Payer: Self-pay | Admitting: Family Medicine

## 2019-09-19 ENCOUNTER — Ambulatory Visit: Payer: Self-pay

## 2019-09-19 VITALS — Ht 66.0 in | Wt 144.0 lb

## 2019-09-19 DIAGNOSIS — M25571 Pain in right ankle and joints of right foot: Secondary | ICD-10-CM

## 2019-09-19 DIAGNOSIS — S82891G Other fracture of right lower leg, subsequent encounter for closed fracture with delayed healing: Secondary | ICD-10-CM

## 2019-09-19 DIAGNOSIS — G8929 Other chronic pain: Secondary | ICD-10-CM

## 2019-09-19 MED ORDER — NITROGLYCERIN 0.1 MG/HR TD PT24: MEDICATED_PATCH | TRANSDERMAL | 12 refills | Status: DC

## 2019-09-19 MED ORDER — VITAMIN D (ERGOCALCIFEROL) 1.25 MG (50000 UNIT) PO CAPS: 50000.0000 [IU] | ORAL_CAPSULE | ORAL | 0 refills | Status: DC

## 2019-09-19 NOTE — Assessment & Plan Note (Signed)
Patient on ultrasound still shows the patient does not have a cough full healing noted.  We discussed repeat injection which patient declined.  We also discussed the possibility of advanced imaging with patient declined as well.  Patient has been somewhat noncompliant.  Encouraged her to go back to the cam walker as much as possible possible.  We discussed potential bone stimulator.  Discussed possible nonweightbearing as well.  Patient wants to go back to the vitamin D as well as the nitroglycerin.  Discussed icing regimen, follow-up again in 4 to 6 weeks

## 2019-09-19 NOTE — Patient Instructions (Signed)
Go back to the boot Look into I walk

## 2019-10-03 MED FILL — LEVOTHYROXINE 75 MCG TABLET: 75 | 84 days supply | Qty: 108 | Fill #1

## 2019-10-05 ENCOUNTER — Ambulatory Visit (INDEPENDENT_AMBULATORY_CARE_PROVIDER_SITE_OTHER): Payer: No Typology Code available for payment source | Admitting: Internal Medicine

## 2019-10-05 ENCOUNTER — Telehealth: Payer: Self-pay

## 2019-10-05 ENCOUNTER — Encounter: Payer: Self-pay | Admitting: Internal Medicine

## 2019-10-05 DIAGNOSIS — J0100 Acute maxillary sinusitis, unspecified: Secondary | ICD-10-CM | POA: Diagnosis not present

## 2019-10-05 MED ORDER — HYDROCOD POLST-CPM POLST ER 10-8 MG/5ML PO SUER: 5.0000 mL | Freq: Two times a day (BID) | ORAL | 0 refills | Status: DC | PRN

## 2019-10-05 MED ORDER — AMOXICILLIN-POT CLAVULANATE 875-125 MG PO TABS: 1.0000 | ORAL_TABLET | Freq: Two times a day (BID) | ORAL | 0 refills | Status: AC

## 2019-10-05 NOTE — Assessment & Plan Note (Signed)
Likely bacterial  Start augmentin otc cold medications Rest, fluid Call if no improvement  

## 2019-10-05 NOTE — Telephone Encounter (Signed)
Can you please send something in for a cough to [facility] in [place].

## 2019-10-05 NOTE — Progress Notes (Signed)
Virtual Visit via Video Note  I connected with Patricia [name] on 10/05/19 at  9:00 AM EST by a video enabled telemedicine application and verified that I am speaking with the correct person using two identifiers.   I discussed the limitations of evaluation and management by telemedicine and the availability of in person appointments. The patient expressed understanding and agreed to proceed.  Present for the visit:  Myself, Dr Billey Gosling, Patricia [name].  The patient is currently at home and I am in the office.    No referring provider.    History of Present Illness: She is here for an acute visit for cold symptoms.   Her symptoms started 4 days ago.  She is experiencing nasal congestion, sinus pain, sore throat primarily from the cough, headaches and a cough that is productive of discolored sputum-green in color.  She has not had any fevers, shortness of breath, wheezing or GI symptoms.  She has tried taking Mucinex, over-the-counter cold medications.  She is not experiencing fever, change in taste or smell and feels she is low risk for Covid.  Review of Systems  Constitutional: Negative for chills and fever.  HENT: Positive for congestion (rarely green sputum), sinus pain and sore throat (from cough). Negative for ear pain.        No loss of taste or smell  Respiratory: Positive for cough (green phlegm). Negative for shortness of breath and wheezing.        Tightness in chest  Gastrointestinal: Negative for diarrhea and nausea.  Neurological: Positive for headaches.     Social History   Socioeconomic History  . Marital status: Single    Spouse name: Not on file  . Number of children: 0  . Years of education: 23  . Highest education level: Not on file  Occupational History  . Occupation: CMA    Employer: [employer]  . Financial resource strain: Not on file  . Food insecurity    Worry: Not on file    Inability: Not on file  .  Transportation needs    Medical: Not on file    Non-medical: Not on file  Tobacco Use  . Smoking status: Never Smoker  . Smokeless tobacco: Never Used  Substance and Sexual Activity  . Alcohol use: Yes    Alcohol/week: 3.0 standard drinks    Types: 1 Glasses of wine, 1 Cans of beer, 1 Shots of liquor per week  . Drug use: No  . Sexual activity: Yes    Birth control/protection: Pill  Lifestyle  . Physical activity    Days per week: Not on file    Minutes per session: Not on file  . Stress: Not on file  Relationships  . Social Herbalist on phone: Not on file    Gets together: Not on file    Attends religious service: Not on file    Active member of club or organization: Not on file    Attends meetings of clubs or organizations: Not on file    Relationship status: Not on file  Other Topics Concern  . Not on file  Social History Narrative   Patricia [name] lives with her father & grandmother.    Denies abuse and feels safe at home.      Observations/Objective: Appears well in NAD Breathing normally Skin appears warm and dry  Assessment and Plan:  See Problem List for Assessment and Plan of chronic medical problems.  Follow Up Instructions:    I discussed the assessment and treatment plan with the patient. The patient was provided an opportunity to ask questions and all were answered. The patient agreed with the plan and demonstrated an understanding of the instructions.   The patient was advised to call back or seek an in-person evaluation if the symptoms worsen or if the condition fails to improve as anticipated.    Binnie Rail, MD

## 2019-10-06 IMAGING — DX DG LUMBAR SPINE COMPLETE W/ BEND
7 series · 7 of 7 positions shown · non-contrast
Comparison: No recent prior.

CLINICAL DATA: Low back pain.  No known injury.

EXAM:
LUMBAR SPINE - COMPLETE WITH BENDING VIEWS

[l-spine ap]
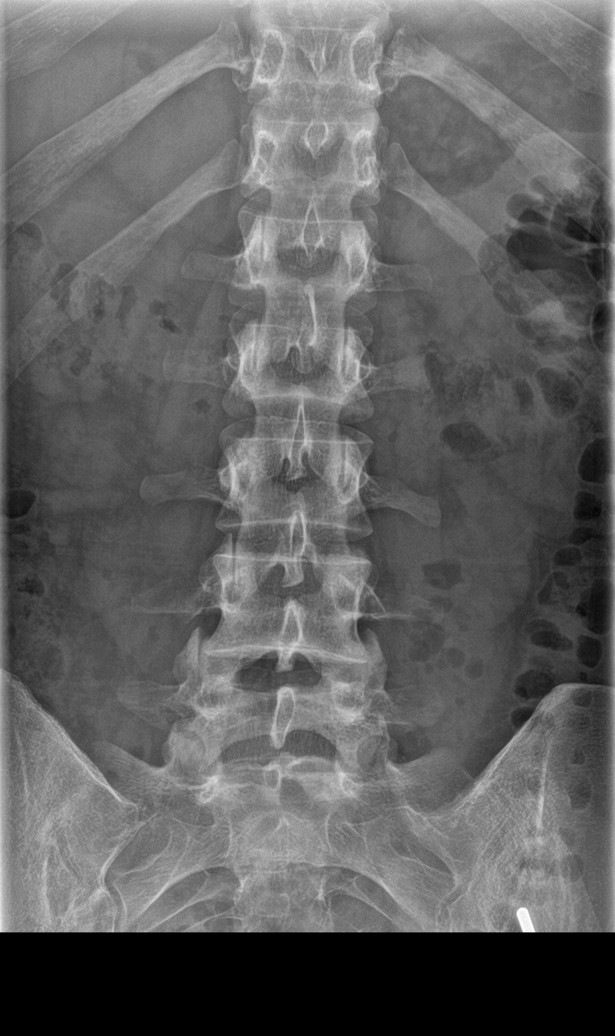

[l-spine obl (1 of 2)]
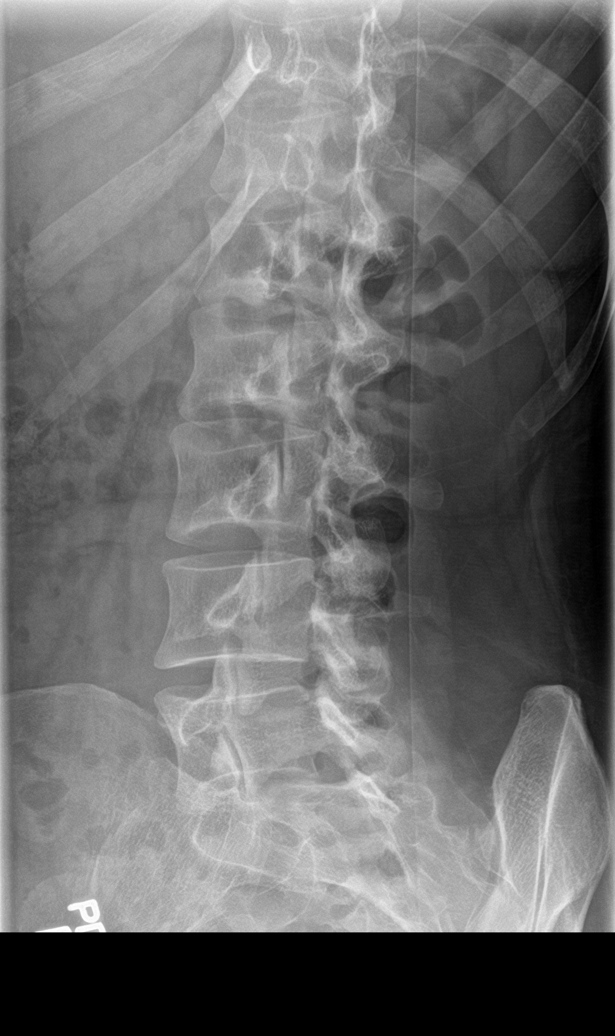

[l-spine obl (2 of 2)]
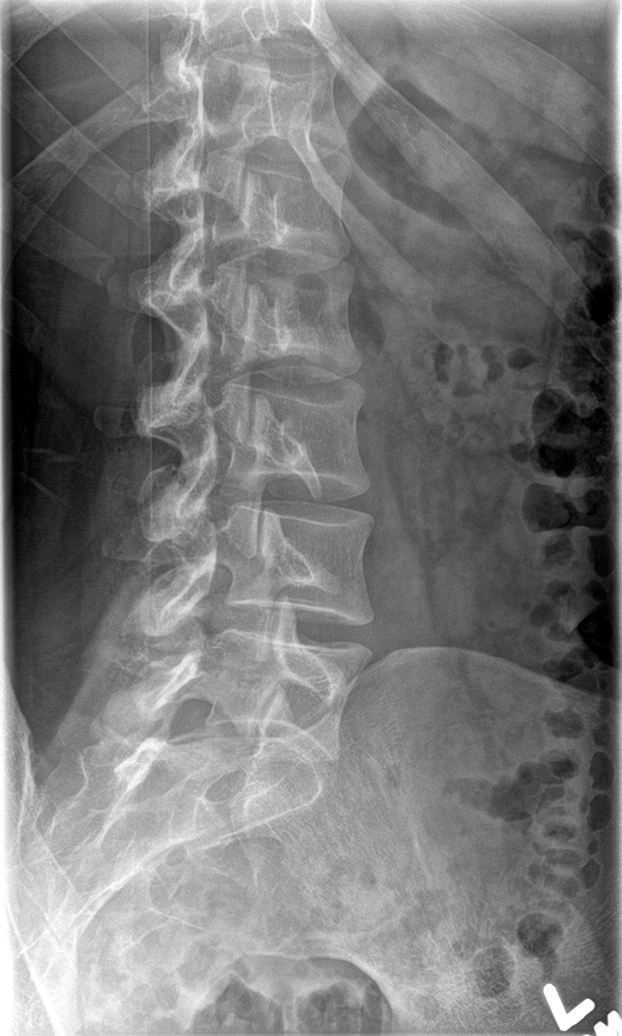

[l-spine lat]
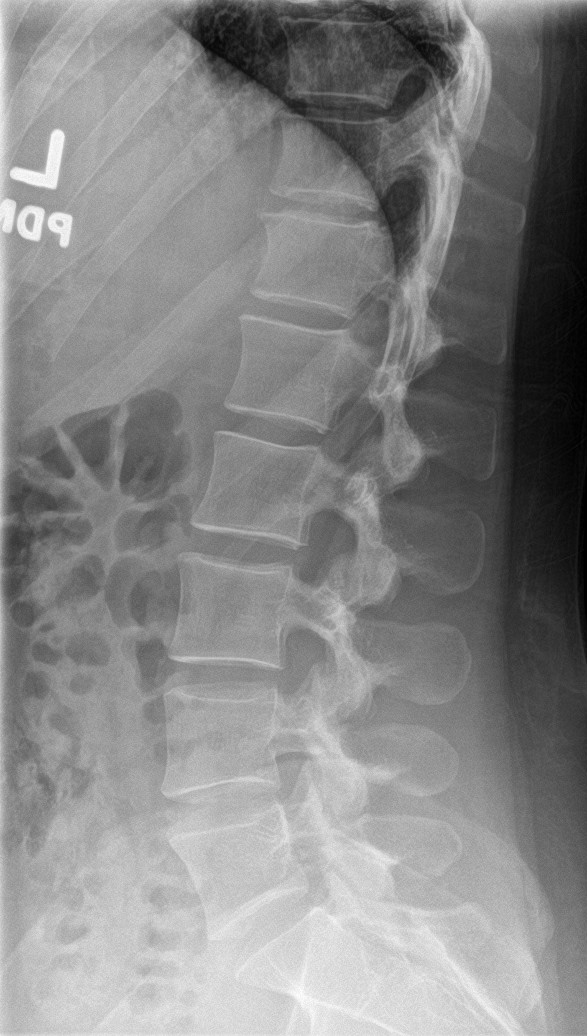

[l-spine flex]
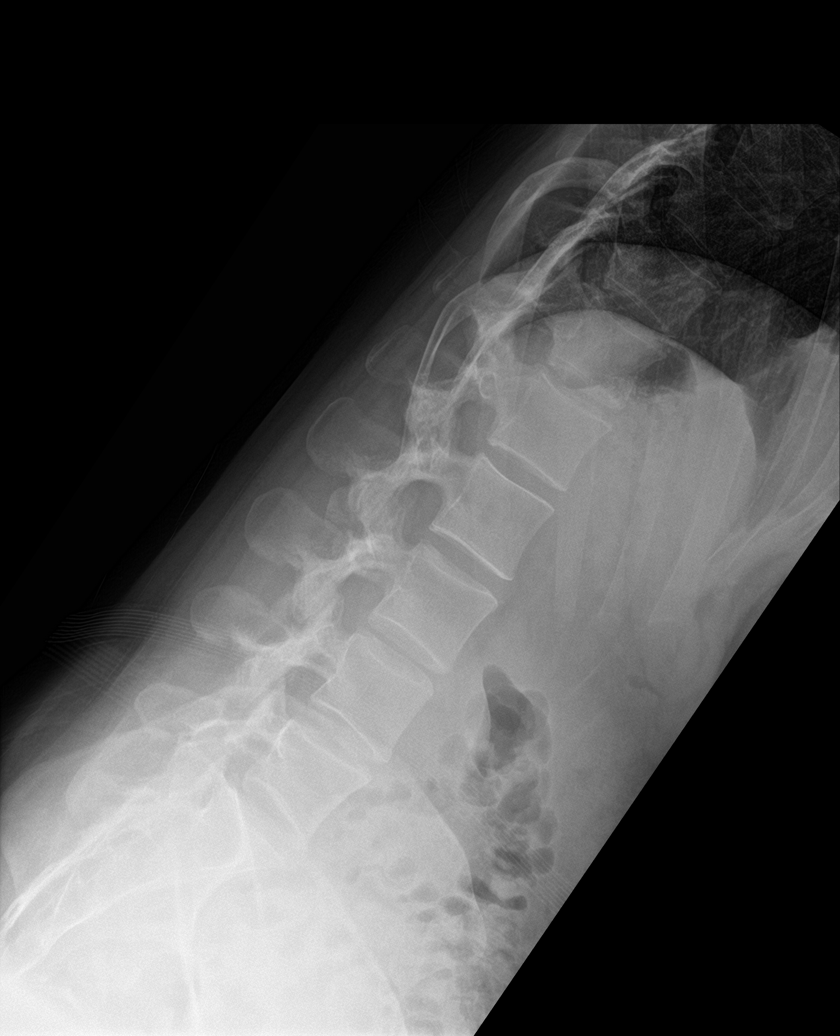

[l-spine ext]
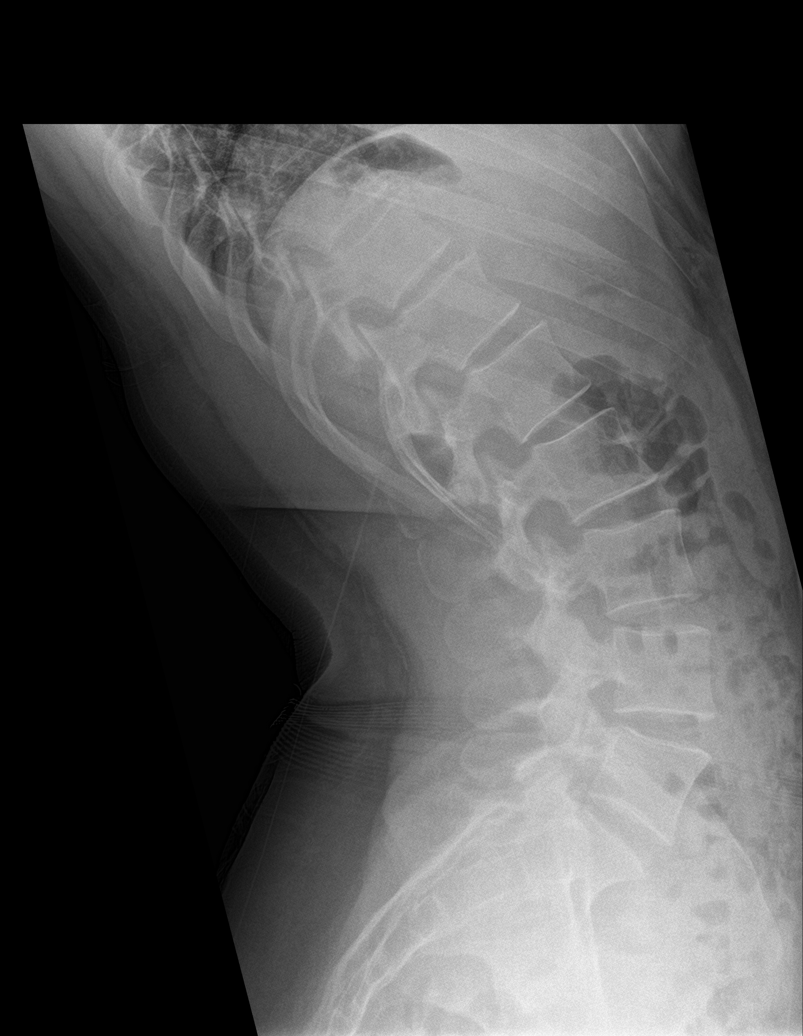

[l-spine spot]
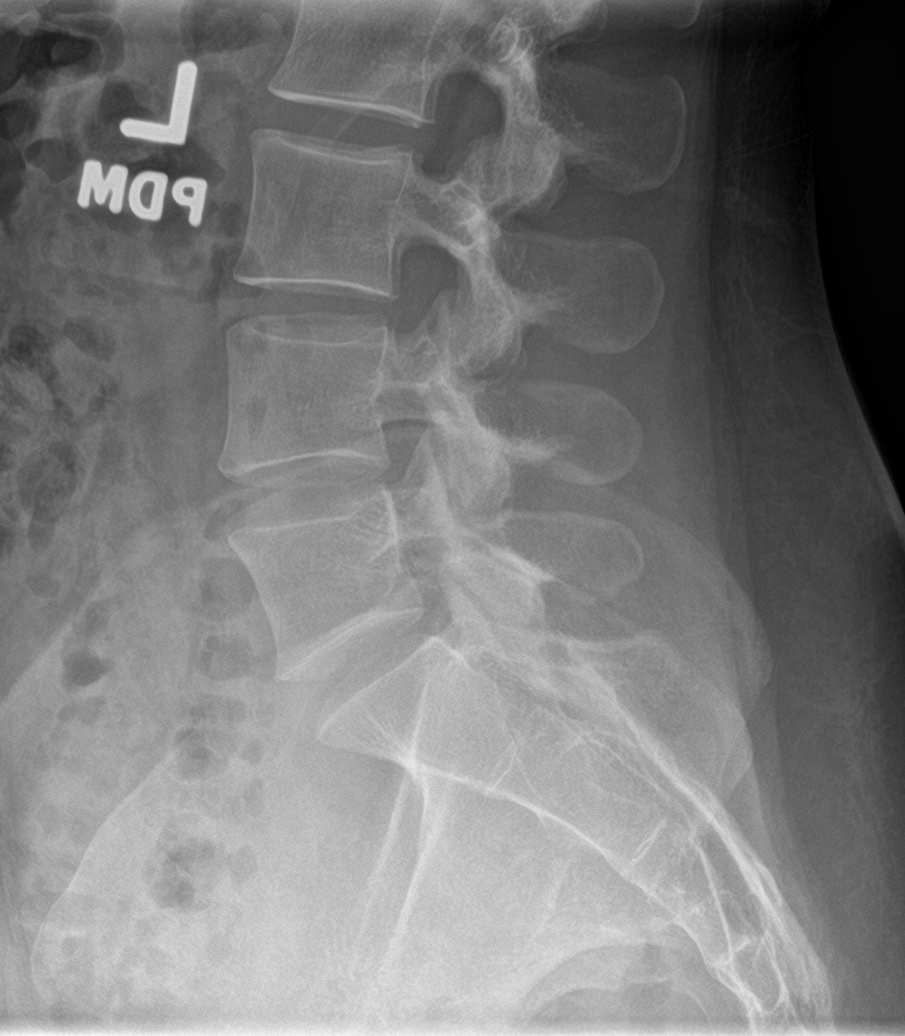

[7 of 7 positions shown; findings below may reference images not displayed]

FINDINGS: Paraspinal soft tissues normal. No acute bony abnormality
identified. No evidence of fracture. Normal alignment and
mineralization. No flexion or extension deformity.
IMPRESSION: No acute or focal abnormality.  No flexion or extension deformity.

## 2019-10-08 ENCOUNTER — Other Ambulatory Visit: Payer: Self-pay | Admitting: Internal Medicine

## 2019-10-08 MED ORDER — FLUCONAZOLE 150 MG PO TABS: 150.0000 mg | ORAL_TABLET | Freq: Once | ORAL | 0 refills | Status: AC

## 2019-10-18 MED FILL — metroNIDAZOLE 1 % GEL: 1 | 30 days supply | Qty: 60 | Fill #0

## 2019-10-23 MED FILL — DROSPIREN-ETH ESTRAD-LEVOME: 3-0.02-0.45 | 70 days supply | Qty: 84 | Fill #4

## 2019-10-26 ENCOUNTER — Ambulatory Visit (INDEPENDENT_AMBULATORY_CARE_PROVIDER_SITE_OTHER): Payer: No Typology Code available for payment source | Admitting: Family Medicine

## 2019-10-26 ENCOUNTER — Other Ambulatory Visit: Payer: Self-pay

## 2019-10-26 ENCOUNTER — Encounter: Payer: Self-pay | Admitting: Family Medicine

## 2019-10-26 VITALS — BP 112/74 | HR 80 | Ht 66.0 in | Wt 144.0 lb

## 2019-10-26 DIAGNOSIS — S82891G Other fracture of right lower leg, subsequent encounter for closed fracture with delayed healing: Secondary | ICD-10-CM

## 2019-10-26 DIAGNOSIS — M940 Chondrocostal junction syndrome [Tietze]: Secondary | ICD-10-CM

## 2019-10-26 DIAGNOSIS — M999 Biomechanical lesion, unspecified: Secondary | ICD-10-CM

## 2019-10-26 NOTE — Assessment & Plan Note (Signed)
Decision today to treat with OMT was based on Physical Exam  After verbal consent patient was treated with HVLA, ME, FPR techniques in cervical, thoracic, rib,  lumbar and sacral areas  Patient tolerated the procedure well with improvement in symptoms  Patient given exercises, stretches and lifestyle modifications  See medications in patient instructions if given  Patient will follow up in 4-8 weeks 

## 2019-10-26 NOTE — Assessment & Plan Note (Signed)
Mild overall, discussed ergonomics with patient working a different type of job at the moment.  Discussed posture and ergonomics, home exercises, icing regimen, patient will follow-up again in 4 to 8 weeks.

## 2019-10-26 NOTE — Progress Notes (Signed)
[name] [place], [place] [address] Phone: [phone] Subjective:   Rito Ehrlich, am serving as a scribe for Dr. Hulan Saas.  This visit occurred during the SARS-CoV-2 public health emergency.  Safety protocols were in place, including screening questions prior to the visit, additional usage of staff PPE, and extensive cleaning of exam room while observing appropriate contact time as indicated for disinfecting solutions.    CC: Low back pain, neck pain, ankle follow-up  [id number]   09/19/2019 Patient on ultrasound still shows the patient does not have a cough full healing noted.  We discussed repeat injection which patient declined.  We also discussed the possibility of advanced imaging with patient declined as well.  Patient has been somewhat noncompliant.  Encouraged her to go back to the cam walker as much as possible possible.  We discussed potential bone stimulator.  Discussed possible nonweightbearing as well.  Patient wants to go back to the vitamin D as well as the nitroglycerin.  Discussed icing regimen, follow-up again in 4 to 6 weeks  10/26/2019 Patricia [name] is a 28 y.o. female coming in with complaint of neck and ankle pain.  Ankle doing much better, mild swelling with walking a lot otherwise no problem.   Neck and back mild, tightness noted patient states that with her working different shifts especially at night she has some mild increase in tenderness as well.      Past Medical History:  Diagnosis Date  . Chicken pox   . Thyroid disease    Past Surgical History:  Procedure Laterality Date  . MOHS SURGERY  2017   nose  . WISDOM TOOTH EXTRACTION     Social History   Socioeconomic History  . Marital status: Single    Spouse name: Not on file  . Number of children: 0  . Years of education: 74  . Highest education level: Not on file  Occupational History  . Occupation: CMA    Employer: [employer]  Tobacco Use  . Smoking status: Never Smoker  . Smokeless tobacco: Never Used  Substance and Sexual Activity  . Alcohol use: Yes    Alcohol/week: 3.0 standard drinks    Types: 1 Glasses of wine, 1 Cans of beer, 1 Shots of liquor per week  . Drug use: No  . Sexual activity: Yes    Birth control/protection: Pill  Other Topics Concern  . Not on file  Social History Narrative   Ms. Braasch lives with her father & grandmother.    Denies abuse and feels safe at home.    Social Determinants of Health   Financial Resource Strain:   . Difficulty of Paying Living Expenses: Not on file  Food Insecurity:   . Worried About Charity fundraiser in the Last Year: Not on file  . Ran Out of Food in the Last Year: Not on file  Transportation Needs:   . Lack of Transportation (Medical): Not on file  . Lack of Transportation (Non-Medical): Not on file  Physical Activity:   . Days of Exercise per Week: Not on file  . Minutes of Exercise per Session: Not on file  Stress:   . Feeling of Stress : Not on file  Social Connections:   . Frequency of Communication with Friends and Family: Not on file  . Frequency of Social Gatherings with Friends and Family: Not on file  . Attends Religious Services: Not on file  .  Active Member of Clubs or Organizations: Not on file  . Attends Archivist Meetings: Not on file  . Marital Status: Not on file   Allergies  Allergen Reactions  . Amoxicillin Nausea And Vomiting   Family History  Problem Relation Age of Onset  . Arthritis Father   . Hypertension Father   . Thyroid disease Father   . Diabetes Father   . Depression Mother   . Thyroid disease Sister   . Healthy Maternal Grandmother   . Diabetes Maternal Grandfather   . Hypertension Maternal Grandfather   . COPD Maternal Grandfather   . Hypertension Paternal Grandmother   . Diabetes Paternal Grandfather     Current Outpatient Medications (Endocrine & Metabolic):  .   BEYAZ 3-0.02-0.451 MG tablet,  .  levothyroxine (SYNTHROID) 75 MCG tablet, TAKE 1 TABLET BY MOUTH ONCE A DAY BEFORE BREAKFAST EXCEPT FOR TWICE A WEEK TAKE 2 TABLETS  Current Outpatient Medications (Cardiovascular):  .  nitroGLYCERIN (NITRO-DUR) 0.1 mg/hr patch, 1/4 patch daily  Current Outpatient Medications (Respiratory):  .  chlorpheniramine-HYDROcodone (TUSSIONEX PENNKINETIC ER) 10-8 MG/5ML SUER, Take 5 mLs by mouth every 12 (twelve) hours as needed for cough.  Current Outpatient Medications (Analgesics):  .  meloxicam (MOBIC) 15 MG tablet, Take 1 tablet (15 mg total) by mouth daily. .  meloxicam (MOBIC) 15 MG tablet, Take 1 tablet (15 mg total) by mouth daily.   Current Outpatient Medications (Other):  Marland Kitchen  Adapalene 0.3 % gel, Apply topically to face at nigh .  clindamycin (CLEOCIN T) 1 % external solution, Apply topically 2 (two) times daily. Marland Kitchen  tiZANidine (ZANAFLEX) 4 MG tablet, Take 1 tablet (4 mg total) by mouth at bedtime. .  Vitamin D, Ergocalciferol, (DRISDOL) 1.25 MG (50000 UT) CAPS capsule, Take 1 capsule (50,000 Units total) by mouth every 7 (seven) days. .  Vitamin D, Ergocalciferol, (DRISDOL) 1.25 MG (50000 UT) CAPS capsule, Take 1 capsule (50,000 Units total) by mouth every 7 (seven) days.    Past medical history, social, surgical and family history all reviewed in electronic medical record.  No pertanent information unless stated regarding to the chief complaint.   Review of Systems:  No headache, visual changes, nausea, vomiting, diarrhea, constipation, dizziness, abdominal pain, skin rash, fevers, chills, night sweats, weight loss, swollen lymph nodes, body aches, joint swelling, muscle aches, chest pain, shortness of breath, mood changes.   Objective  There were no vitals taken for this visit.     General: No apparent distress alert and oriented x3 mood and affect normal, dressed appropriately.  HEENT: Pupils equal, extraocular movements intact  Respiratory:  Patient's speak in full sentences and does not appear short of breath  Cardiovascular: No lower extremity edema, non tender, no erythema  Skin: Warm dry intact with no signs of infection or rash on extremities or on axial skeleton.  Abdomen: Soft nontender  Neuro: Cranial nerves II through XII are intact, neurovascularly intact in all extremities with 2+ DTRs and 2+ pulses.  Lymph: No lymphadenopathy of posterior or anterior cervical chain or axillae bilaterally.  Gait normal with good balance and coordination.  MSK:  Non tender with full range of motion and good stability and symmetric strength and tone of shoulders, elbows, wrist, hip, knee bilaterally.  Ankle: Right Very mild anterior swelling noted Range of motion is full in all directions. Strength is 5/5 in all directions. Stable lateral and medial ligaments; squeeze test and kleiger test unremarkable; Talar dome nontender; No pain at base of  5th MT; No tenderness over cuboid; No tenderness over N spot or navicular prominence No tenderness on posterior aspects of lateral and medial malleolus No sign of peroneal tendon subluxations or tenderness to palpation Negative tarsal tunnel tinel's Able to walk 4 steps.  Back Exam:  Inspection: Mild loss of lordosis Motion: Flexion 45 deg, Extension 25 deg, Side Bending to 35 deg bilaterally,  Rotation to 45 deg bilaterally  SLR laying: Negative  XSLR laying: Negative  Palpable tenderness: Tender to palpation in paraspinal musculature lumbar spine right greater than left. FABER: negative. Sensory change: Gross sensation intact to all lumbar and sacral dermatomes.  Reflexes: 2+ at both patellar tendons, 2+ at achilles tendons, Babinski's downgoing.  Strength at foot  Plantar-flexion: 5/5 Dorsi-flexion: 5/5 Eversion: 5/5 Inversion: 5/5  Leg strength  Quad: 5/5 Hamstring: 5/5 Hip flexor: 5/5 Hip abductors: 5/5  Gait unremarkable.  Osteopathic findings  C2 flexed rotated and side bent  right C6 flexed rotated and side bent left T3 extended rotated and side bent right inhaled third rib T9 extended rotated and side bent left L2 flexed rotated and side bent right Sacrum right on right    Impression and Recommendations:     This case required medical decision making of moderate complexity. The above documentation has been reviewed and is accurate and complete Lyndal Pulley, DO       Note: This dictation was prepared with Dragon dictation along with smaller phrase technology. Any transcriptional errors that result from this process are unintentional.

## 2019-10-26 NOTE — Assessment & Plan Note (Signed)
Ankle doing well, discussed the possibility of advanced imaging for possible talar dome injury but patient has declined it at this moment because she is making significant improvement.  We discussed icing regimen and home exercises, discussed which activities to doing which wants to avoid.  Patient will increase activity as tolerated.  Follow-up again in 4 to 8 weeks

## 2019-11-29 ENCOUNTER — Ambulatory Visit: Payer: No Typology Code available for payment source | Admitting: Family Medicine

## 2019-12-05 ENCOUNTER — Other Ambulatory Visit: Payer: Self-pay | Admitting: Family Medicine

## 2019-12-09 ENCOUNTER — Other Ambulatory Visit: Payer: Self-pay

## 2019-12-09 ENCOUNTER — Ambulatory Visit (INDEPENDENT_AMBULATORY_CARE_PROVIDER_SITE_OTHER): Payer: No Typology Code available for payment source | Admitting: Family Medicine

## 2019-12-09 ENCOUNTER — Encounter: Payer: Self-pay | Admitting: Family Medicine

## 2019-12-09 VITALS — BP 100/70 | HR 79 | Ht 66.0 in

## 2019-12-09 DIAGNOSIS — M62830 Muscle spasm of back: Secondary | ICD-10-CM | POA: Diagnosis not present

## 2019-12-09 DIAGNOSIS — M999 Biomechanical lesion, unspecified: Secondary | ICD-10-CM

## 2019-12-09 NOTE — Patient Instructions (Signed)
Good to see you See me again in  

## 2019-12-09 NOTE — Progress Notes (Signed)
[facility] [address] [place] Phone: [phone] Subjective:   I [name] am serving as a Education administrator for Dr. Hulan Saas.  This visit occurred during the SARS-CoV-2 public health emergency.  Safety protocols were in place, including screening questions prior to the visit, additional usage of staff PPE, and extensive cleaning of exam room while observing appropriate contact time as indicated for disinfecting solutions.   I'm seeing this patient by the request  of:  Binnie Rail, MD  CC: back apin follow up   [id number]   10/26/2019 Ankle doing well, discussed the possibility of advanced imaging for possible talar dome injury but patient has declined it at this moment because she is making significant improvement.  We discussed icing regimen and home exercises, discussed which activities to doing which wants to avoid.  Patient will increase activity as tolerated.  Follow-up again in 4 to 8 weeks  12/09/2019 Patricia [name] is a 29 y.o. female coming in with complaint of ankle and back pain. OMT. Ankle is doing well. Back is painful. States she feels a pinch in her mid back.  Patient back has some mild tightness more in the thoracolumbar juncture again.  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  Has noticed more tightness.  Sometimes will catch her breath.  Denies any fevers or chills.     Past Medical History:  Diagnosis Date  . Chicken pox   . Thyroid disease    Past Surgical History:  Procedure Laterality Date  . MOHS SURGERY  2017   nose  . WISDOM TOOTH EXTRACTION     Social History   Socioeconomic History  . Marital status: Single    Spouse name: Not on file  . Number of children: 0  . Years of education: 44  . Highest education level: Not on file  Occupational History  . Occupation: CMA    Employer: [employer]  Tobacco Use  . Smoking status: Never Smoker  . Smokeless tobacco: Never Used    Substance and Sexual Activity  . Alcohol use: Yes    Alcohol/week: 3.0 standard drinks    Types: 1 Glasses of wine, 1 Cans of beer, 1 Shots of liquor per week  . Drug use: No  . Sexual activity: Yes    Birth control/protection: Pill  Other Topics Concern  . Not on file  Social History Narrative   Ms. Veles lives with her father & grandmother.    Denies abuse and feels safe at home.    Social Determinants of Health   Financial Resource Strain:   . Difficulty of Paying Living Expenses: Not on file  Food Insecurity:   . Worried About Charity fundraiser in the Last Year: Not on file  . Ran Out of Food in the Last Year: Not on file  Transportation Needs:   . Lack of Transportation (Medical): Not on file  . Lack of Transportation (Non-Medical): Not on file  Physical Activity:   . Days of Exercise per Week: Not on file  . Minutes of Exercise per Session: Not on file  Stress:   . Feeling of Stress : Not on file  Social Connections:   . Frequency of Communication with Friends and Family: Not on file  . Frequency of Social Gatherings with Friends and Family: Not on file  . Attends Religious Services: Not on file  . Active Member of Clubs or Organizations: Not on file  . Attends  Club or Organization Meetings: Not on file  . Marital Status: Not on file   Allergies  Allergen Reactions  . Amoxicillin Nausea And Vomiting   Family History  Problem Relation Age of Onset  . Arthritis Father   . Hypertension Father   . Thyroid disease Father   . Diabetes Father   . Depression Mother   . Thyroid disease Sister   . Healthy Maternal Grandmother   . Diabetes Maternal Grandfather   . Hypertension Maternal Grandfather   . COPD Maternal Grandfather   . Hypertension Paternal Grandmother   . Diabetes Paternal Grandfather     Current Outpatient Medications (Endocrine & Metabolic):  .  BEYAZ 3-0.02-0.451 MG tablet,  .  levothyroxine (SYNTHROID) 75 MCG tablet, TAKE 1 TABLET BY MOUTH  ONCE A DAY BEFORE BREAKFAST EXCEPT FOR TWICE A WEEK TAKE 2 TABLETS  Current Outpatient Medications (Cardiovascular):  .  nitroGLYCERIN (NITRO-DUR) 0.1 mg/hr patch, 1/4 patch daily  Current Outpatient Medications (Respiratory):  .  chlorpheniramine-HYDROcodone (TUSSIONEX PENNKINETIC ER) 10-8 MG/5ML SUER, Take 5 mLs by mouth every 12 (twelve) hours as needed for cough.  Current Outpatient Medications (Analgesics):  .  meloxicam (MOBIC) 15 MG tablet, Take 1 tablet (15 mg total) by mouth daily.   Current Outpatient Medications (Other):  Marland Kitchen  Adapalene 0.3 % gel, Apply topically to face at nigh .  clindamycin (CLEOCIN T) 1 % external solution, Apply topically 2 (two) times daily. Marland Kitchen  tiZANidine (ZANAFLEX) 4 MG tablet, Take 1 tablet (4 mg total) by mouth at bedtime. .  Vitamin D, Ergocalciferol, (DRISDOL) 1.25 MG (50000 UNIT) CAPS capsule, TAKE 1 CAPSULE (50,000 UNITS TOTAL) BY MOUTH EVERY 7 (SEVEN) DAYS. Marland Kitchen  Vitamin D, Ergocalciferol, (DRISDOL) 1.25 MG (50000 UT) CAPS capsule, Take 1 capsule (50,000 Units total) by mouth every 7 (seven) days.   Reviewed prior external information including notes and imaging from  primary care provider As well as notes that were available from care everywhere and other healthcare systems.  Past medical history, social, surgical and family history all reviewed in electronic medical record.  No pertanent information unless stated regarding to the chief complaint.   Review of Systems:  No headache, visual changes, nausea, vomiting, diarrhea, constipation, dizziness, abdominal pain, skin rash, fevers, chills, night sweats, weight loss, swollen lymph nodes, body aches, joint swelling, chest pain, shortness of breath, mood changes. POSITIVE muscle aches  Objective  Blood pressure 100/70, pulse 79, height 5\' 6"  (1.676 m), SpO2 97 %.   General: No apparent distress alert and oriented x3 mood and affect normal, dressed appropriately.  HEENT: Pupils equal, extraocular  movements intact  Respiratory: Patient's speak in full sentences and does not appear short of breath  Cardiovascular: No lower extremity edema, non tender, no erythema  Skin: Warm dry intact with no signs of infection or rash on extremities or on axial skeleton.  Abdomen: Soft nontender  Neuro: Cranial nerves II through XII are intact, neurovascularly intact in all extremities with 2+ DTRs and 2+ pulses.  Lymph: No lymphadenopathy of posterior or anterior cervical chain or axillae bilaterally.  Gait normal with good balance and coordination.  MSK:  tender with full range of motion and good stability and symmetric strength and tone of shoulders, elbows, wrist, hip, knee and ankles bilaterally.  Back exam shows that there is tightness in the thoracolumbar juncture left greater than right.  Patient does have some tenderness to palpation in the right sacroiliac joint.  Neck does have some limited sidebending bilaterally.  No radicular symptoms to any of this extremities and 5 out of 5 strength  Osteopathic findings  C2 flexed rotated and side bent right C6 flexed rotated and side bent left T3 extended rotated and side bent right inhaled third rib T5 extended rotated and side bent left L2 flexed rotated and side bent right Sacrum right on right    Impression and Recommendations:     This case required medical decision making of moderate complexity. The above documentation has been reviewed and is accurate and complete Lyndal Pulley, DO       Note: This dictation was prepared with Dragon dictation along with smaller phrase technology. Any transcriptional errors that result from this process are unintentional.

## 2019-12-09 NOTE — Assessment & Plan Note (Addendum)
Decision today to treat with OMT was based on Physical Exam social determinants include educational quality with patient still in school at the moment, economic hardship and this is a chronic condition with mild exacerbation.  After verbal consent patient was treated with HVLA, ME, FPR techniques in cervical, thoracic, rib, lumbar and sacral areas  Patient tolerated the procedure well with improvement in symptoms  Patient given exercises, stretches and lifestyle modifications  See medications in patient instructions if given  Patient will follow up in 4-8 weeks

## 2019-12-09 NOTE — Assessment & Plan Note (Signed)
Continued intermittent spasms.  Patient is working night shifts that is likely contributing to some of it.  Discussed icing regimen and home exercise, which activities to do which was to avoid.  Patient is to increase activity as tolerated.  Follow-up again in 4 to 8 weeks

## 2020-01-03 ENCOUNTER — Other Ambulatory Visit: Payer: Self-pay

## 2020-01-03 DIAGNOSIS — E038 Other specified hypothyroidism: Secondary | ICD-10-CM

## 2020-01-03 MED ORDER — LEVOTHYROXINE SODIUM 75 MCG PO TABS: ORAL_TABLET | ORAL | 1 refills | Status: DC

## 2020-01-03 MED FILL — LEVOTHYROXINE 75 MCG TABLET: 75 | 90 days supply | Qty: 114 | Fill #0

## 2020-01-04 MED FILL — DROSPIREN-ETH ESTRAD-LEVOME: 3-0.02-0.45 | 70 days supply | Qty: 84 | Fill #0

## 2020-01-12 ENCOUNTER — Other Ambulatory Visit: Payer: Self-pay

## 2020-01-12 ENCOUNTER — Encounter: Payer: Self-pay | Admitting: Family Medicine

## 2020-01-12 ENCOUNTER — Ambulatory Visit (INDEPENDENT_AMBULATORY_CARE_PROVIDER_SITE_OTHER): Payer: No Typology Code available for payment source | Admitting: Family Medicine

## 2020-01-12 VITALS — BP 100/62 | HR 85 | Ht 66.0 in | Wt 148.0 lb

## 2020-01-12 DIAGNOSIS — M999 Biomechanical lesion, unspecified: Secondary | ICD-10-CM

## 2020-01-12 DIAGNOSIS — M62830 Muscle spasm of back: Secondary | ICD-10-CM

## 2020-01-12 NOTE — Progress Notes (Signed)
[facility] [address] [place] Phone: [phone] Subjective:   I [name] am serving as a Education administrator for Dr. Hulan Saas.  This visit occurred during the SARS-CoV-2 public health emergency.  Safety protocols were in place, including screening questions prior to the visit, additional usage of staff PPE, and extensive cleaning of exam room while observing appropriate contact time as indicated for disinfecting solutions.   This visit occurred during the SARS-CoV-2 public health emergency.  Safety protocols were in place, including screening questions prior to the visit, additional usage of staff PPE, and extensive cleaning of exam room while observing appropriate contact time as indicated for disinfecting solutions.   I'm seeing this patient by the request  of:  Binnie Rail, MD  CC: Low back pain follow-up  [id number]  Patricia [name] is a 29 y.o. female coming in with complaint of back pain. Last seen on 12/09/2019 for OMT. Patient states she is in pain. States she is over due for OMT. Patient states that she is going to be cutting back at work secondary to the amount of pain.  Patient states that she feels like she is just fatiguing and not sleeping well.      Past Medical History:  Diagnosis Date  . Chicken pox   . Thyroid disease    Past Surgical History:  Procedure Laterality Date  . MOHS SURGERY  2017   nose  . WISDOM TOOTH EXTRACTION     Social History   Socioeconomic History  . Marital status: Single    Spouse name: Not on file  . Number of children: 0  . Years of education: 65  . Highest education level: Not on file  Occupational History  . Occupation: CMA    Employer: [employer]  Tobacco Use  . Smoking status: Never Smoker  . Smokeless tobacco: Never Used  Substance and Sexual Activity  . Alcohol use: Yes    Alcohol/week: 3.0 standard drinks    Types: 1 Glasses of wine, 1 Cans of  beer, 1 Shots of liquor per week  . Drug use: No  . Sexual activity: Yes    Birth control/protection: Pill  Other Topics Concern  . Not on file  Social History Narrative   Ms. Donze lives with her father & grandmother.    Denies abuse and feels safe at home.    Social Determinants of Health   Financial Resource Strain:   . Difficulty of Paying Living Expenses:   Food Insecurity:   . Worried About Charity fundraiser in the Last Year:   . Arboriculturist in the Last Year:   Transportation Needs:   . Film/video editor (Medical):   Marland Kitchen Lack of Transportation (Non-Medical):   Physical Activity:   . Days of Exercise per Week:   . Minutes of Exercise per Session:   Stress:   . Feeling of Stress :   Social Connections:   . Frequency of Communication with Friends and Family:   . Frequency of Social Gatherings with Friends and Family:   . Attends Religious Services:   . Active Member of Clubs or Organizations:   . Attends Archivist Meetings:   Marland Kitchen Marital Status:    Allergies  Allergen Reactions  . Amoxicillin Nausea And Vomiting   Family History  Problem Relation Age of Onset  . Arthritis Father   . Hypertension Father   . Thyroid disease Father   .  Diabetes Father   . Depression Mother   . Thyroid disease Sister   . Healthy Maternal Grandmother   . Diabetes Maternal Grandfather   . Hypertension Maternal Grandfather   . COPD Maternal Grandfather   . Hypertension Paternal Grandmother   . Diabetes Paternal Grandfather     Current Outpatient Medications (Endocrine & Metabolic):  .  BEYAZ 3-0.02-0.451 MG tablet,  .  levothyroxine (SYNTHROID) 75 MCG tablet, TAKE 1 TABLET BY MOUTH ONCE A DAY BEFORE BREAKFAST EXCEPT FOR TWICE A WEEK TAKE 2 TABLETS  Current Outpatient Medications (Cardiovascular):  .  nitroGLYCERIN (NITRO-DUR) 0.1 mg/hr patch, 1/4 patch daily  Current Outpatient Medications (Respiratory):  .  chlorpheniramine-HYDROcodone (TUSSIONEX  PENNKINETIC ER) 10-8 MG/5ML SUER, Take 5 mLs by mouth every 12 (twelve) hours as needed for cough.  Current Outpatient Medications (Analgesics):  .  meloxicam (MOBIC) 15 MG tablet, Take 1 tablet (15 mg total) by mouth daily.   Current Outpatient Medications (Other):  Marland Kitchen  Adapalene 0.3 % gel, Apply topically to face at nigh .  clindamycin (CLEOCIN T) 1 % external solution, Apply topically 2 (two) times daily. Marland Kitchen  tiZANidine (ZANAFLEX) 4 MG tablet, Take 1 tablet (4 mg total) by mouth at bedtime. .  Vitamin D, Ergocalciferol, (DRISDOL) 1.25 MG (50000 UNIT) CAPS capsule, TAKE 1 CAPSULE (50,000 UNITS TOTAL) BY MOUTH EVERY 7 (SEVEN) DAYS. Marland Kitchen  Vitamin D, Ergocalciferol, (DRISDOL) 1.25 MG (50000 UT) CAPS capsule, Take 1 capsule (50,000 Units total) by mouth every 7 (seven) days.   Reviewed prior external information including notes and imaging from  primary care provider As well as notes that were available from care everywhere and other healthcare systems.  Past medical history, social, surgical and family history all reviewed in electronic medical record.  No pertanent information unless stated regarding to the chief complaint.   Review of Systems:  No headache, visual changes, nausea, vomiting, diarrhea, constipation, dizziness, abdominal pain, skin rash, fevers, chills, night sweats, weight loss, swollen lymph nodes, body aches, joint swelling, chest pain, shortness of breath, mood changes. POSITIVE muscle aches  Objective  Blood pressure 100/62, pulse 85, height 5\' 6"  (1.676 m), weight 148 lb (67.1 kg), SpO2 98 %.   General: No apparent distress alert and oriented x3 mood and affect normal, dressed appropriately.  HEENT: Pupils equal, extraocular movements intact  Respiratory: Patient's speak in full sentences and does not appear short of breath  Cardiovascular: No lower extremity edema, non tender, no erythema  Skin: Warm dry intact with no signs of infection or rash on extremities or on  axial skeleton.  Abdomen: Soft nontender  Neuro: Cranial nerves II through XII are intact, neurovascularly intact in all extremities with 2+ DTRs and 2+ pulses.  Lymph: No lymphadenopathy of posterior or anterior cervical chain or axillae bilaterally.  Gait normal with good balance and coordination.  MSK:  tender with full range of motion and good stability and symmetric strength and tone of shoulders, elbows, wrist, hip, knee and ankles bilaterally.  Low back exam does show some tenderness to palpation in the paraspinal musculature more in the thoracolumbar juncture than usual.  Negative straight leg test. Positive Corky Sox on the left side which is different than patient's usual as well.  5 out of 5 strength in lower extremities.  Osteopathic findings C6 flexed rotated and side bent left T3 extended rotated and side bent right inhaled third rib T6 extended rotated and side bent left L2 flexed rotated and side bent right Sacrum left on left  Impression and Recommendations:     This case required medical decision making of moderate complexity. The above documentation has been reviewed and is accurate and complete Lyndal Pulley, DO       Note: This dictation was prepared with Dragon dictation along with smaller phrase technology. Any transcriptional errors that result from this process are unintentional.

## 2020-01-12 NOTE — Patient Instructions (Signed)
Good to see you Find time for yourself See me again in 6-8 weeks

## 2020-01-12 NOTE — Assessment & Plan Note (Signed)
Decision today to treat with OMT was based on Physical Exam  After verbal consent patient was treated with HVLA, ME, FPR techniques in cervical, thoracic, rib,  lumbar and sacral areas  Patient tolerated the procedure well with improvement in symptoms  Patient given exercises, stretches and lifestyle modifications  See medications in patient instructions if given  Patient will follow up in 4-8 weeks 

## 2020-01-12 NOTE — Assessment & Plan Note (Signed)
Mild chronic problem with mild exacerbation.  Responding fairly well to osteopathic manipulation and home exercises though.  Discussed which activities to doing which wants to avoid.  Patient should increase activity slowly.  Follow-up again in 4 to 8 weeks.

## 2020-02-10 ENCOUNTER — Other Ambulatory Visit: Payer: Self-pay

## 2020-02-10 ENCOUNTER — Telehealth: Payer: Self-pay | Admitting: Family Medicine

## 2020-02-10 MED ORDER — MELOXICAM 15 MG PO TABS: 15.0000 mg | ORAL_TABLET | Freq: Every day | ORAL | 0 refills | Status: DC

## 2020-02-10 MED FILL — MELOXICAM 15 MG TABLET: 15 | 30 days supply | Qty: 30 | Fill #0

## 2020-02-10 NOTE — Telephone Encounter (Signed)
Patient called requesting a refill on her meloxicam (MOBIC) 15 MG tablet to be sent to [facility].

## 2020-02-21 ENCOUNTER — Other Ambulatory Visit (HOSPITAL_COMMUNITY): Payer: Self-pay | Admitting: Gynecology

## 2020-02-21 LAB — HM PAP SMEAR

## 2020-02-28 ENCOUNTER — Ambulatory Visit (INDEPENDENT_AMBULATORY_CARE_PROVIDER_SITE_OTHER): Payer: No Typology Code available for payment source | Admitting: Family Medicine

## 2020-02-28 ENCOUNTER — Other Ambulatory Visit: Payer: Self-pay

## 2020-02-28 ENCOUNTER — Encounter: Payer: Self-pay | Admitting: Family Medicine

## 2020-02-28 VITALS — BP 100/70 | HR 71 | Ht 66.0 in

## 2020-02-28 DIAGNOSIS — M999 Biomechanical lesion, unspecified: Secondary | ICD-10-CM | POA: Diagnosis not present

## 2020-02-28 DIAGNOSIS — M940 Chondrocostal junction syndrome [Tietze]: Secondary | ICD-10-CM | POA: Diagnosis not present

## 2020-02-28 NOTE — Patient Instructions (Signed)
Body helix ankle compression Duexis 3 times a day for 3 days and pennsaid 6-8 weeks

## 2020-02-28 NOTE — Assessment & Plan Note (Signed)

## 2020-02-28 NOTE — Progress Notes (Signed)
[facility] [address] [place] Phone: [phone] Subjective:   I Patricia [name] am serving as a Education administrator for Dr. Hulan Saas.  This visit occurred during the SARS-CoV-2 public health emergency.  Safety protocols were in place, including screening questions prior to the visit, additional usage of staff PPE, and extensive cleaning of exam room while observing appropriate contact time as indicated for disinfecting solutions.   I'm seeing this patient by the request  of:  Binnie Rail, MD  CC: Back pain and neck pain follow-up  [id number]  Patricia [name] is a 29 y.o. female coming in with complaint of back pain. Last seen 01/12/2020 for OMT. Patient states she has been having issues with her upper traps. Tightness all the time. kn ows that stress is playing a role      Past Medical History:  Diagnosis Date  . Chicken pox   . Thyroid disease    Past Surgical History:  Procedure Laterality Date  . MOHS SURGERY  2017   nose  . WISDOM TOOTH EXTRACTION     Social History   Socioeconomic History  . Marital status: Single    Spouse name: Not on file  . Number of children: 0  . Years of education: 5  . Highest education level: Not on file  Occupational History  . Occupation: CMA    Employer: [employer]  Tobacco Use  . Smoking status: Never Smoker  . Smokeless tobacco: Never Used  Substance and Sexual Activity  . Alcohol use: Yes    Alcohol/week: 3.0 standard drinks    Types: 1 Glasses of wine, 1 Cans of beer, 1 Shots of liquor per week  . Drug use: No  . Sexual activity: Yes    Birth control/protection: Pill  Other Topics Concern  . Not on file  Social History Narrative   Ms. Kurdziel lives with her father & grandmother.    Denies abuse and feels safe at home.    Social Determinants of Health   Financial Resource Strain:   . Difficulty of Paying Living Expenses:   Food Insecurity:   . Worried  About Charity fundraiser in the Last Year:   . Arboriculturist in the Last Year:   Transportation Needs:   . Film/video editor (Medical):   Marland Kitchen Lack of Transportation (Non-Medical):   Physical Activity:   . Days of Exercise per Week:   . Minutes of Exercise per Session:   Stress:   . Feeling of Stress :   Social Connections:   . Frequency of Communication with Friends and Family:   . Frequency of Social Gatherings with Friends and Family:   . Attends Religious Services:   . Active Member of Clubs or Organizations:   . Attends Archivist Meetings:   Marland Kitchen Marital Status:    Allergies  Allergen Reactions  . Amoxicillin Nausea And Vomiting   Family History  Problem Relation Age of Onset  . Arthritis Father   . Hypertension Father   . Thyroid disease Father   . Diabetes Father   . Depression Mother   . Thyroid disease Sister   . Healthy Maternal Grandmother   . Diabetes Maternal Grandfather   . Hypertension Maternal Grandfather   . COPD Maternal Grandfather   . Hypertension Paternal Grandmother   . Diabetes Paternal Grandfather     Current Outpatient Medications (Endocrine & Metabolic):  .  BEYAZ 3-0.02-0.451 MG  tablet,  .  levothyroxine (SYNTHROID) 75 MCG tablet, TAKE 1 TABLET BY MOUTH ONCE A DAY BEFORE BREAKFAST EXCEPT FOR TWICE A WEEK TAKE 2 TABLETS  Current Outpatient Medications (Cardiovascular):  .  nitroGLYCERIN (NITRO-DUR) 0.1 mg/hr patch, 1/4 patch daily  Current Outpatient Medications (Respiratory):  .  chlorpheniramine-HYDROcodone (TUSSIONEX PENNKINETIC ER) 10-8 MG/5ML SUER, Take 5 mLs by mouth every 12 (twelve) hours as needed for cough.  Current Outpatient Medications (Analgesics):  .  meloxicam (MOBIC) 15 MG tablet, Take 1 tablet (15 mg total) by mouth daily.   Current Outpatient Medications (Other):  Marland Kitchen  Adapalene 0.3 % gel, Apply topically to face at nigh .  clindamycin (CLEOCIN T) 1 % external solution, Apply topically 2 (two) times  daily. Marland Kitchen  tiZANidine (ZANAFLEX) 4 MG tablet, Take 1 tablet (4 mg total) by mouth at bedtime. .  Vitamin D, Ergocalciferol, (DRISDOL) 1.25 MG (50000 UNIT) CAPS capsule, TAKE 1 CAPSULE (50,000 UNITS TOTAL) BY MOUTH EVERY 7 (SEVEN) DAYS. Marland Kitchen  Vitamin D, Ergocalciferol, (DRISDOL) 1.25 MG (50000 UT) CAPS capsule, Take 1 capsule (50,000 Units total) by mouth every 7 (seven) days.   Reviewed prior external information including notes and imaging from  primary care provider As well as notes that were available from care everywhere and other healthcare systems.  Past medical history, social, surgical and family history all reviewed in electronic medical record.  No pertanent information unless stated regarding to the chief complaint.   Review of Systems:  No headache, visual changes, nausea, vomiting, diarrhea, constipation, dizziness, abdominal pain, skin rash, fevers, chills, night sweats, weight loss, swollen lymph nodes,  joint swelling, chest pain, shortness of breath, mood changes. POSITIVE muscle aches, body aches   Objective  Blood pressure 100/70, pulse 71, height 5\' 6"  (1.676 m), SpO2 98 %.   General: No apparent distress alert and oriented x3 mood and affect normal, dressed appropriately.  HEENT: Pupils equal, extraocular movements intact  Respiratory: Patient's speak in full sentences and does not appear short of breath  Cardiovascular: No lower extremity edema, non tender, no erythema  Neuro: Cranial nerves II through XII are intact, neurovascularly intact in all extremities with 2+ DTRs and 2+ pulses.  Gait normal with good balance and coordination.  MSK:  tender with full range of motion and good stability and symmetric strength and tone of shoulders, elbows, wrist, hip, knee and ankles bilaterally.     Patient's upper back does have significant tightness in the parascapular region right than left.  Patient does have a slight bruise still noted on the right side.  Patient does have  tightness in the neck bilaterally.  Osteopathic findings C2 flexed rotated and side bent right C4 flexed rotated and side bent left C6 flexed rotated and side bent left T3 extended rotated and side bent right inhaled third rib T9 extended rotated and side bent left L2 flexed rotated and side bent right Sacrum right on right  Impression and Recommendations:     This case required medical decision making of moderate complexity. The above documentation has been reviewed and is accurate and complete Lyndal Pulley, DO       Note: This dictation was prepared with Dragon dictation along with smaller phrase technology. Any transcriptional errors that result from this process are unintentional.

## 2020-02-28 NOTE — Assessment & Plan Note (Signed)
w chronic problem with exacerbation.  Pain syndrome seems to be more secondary to posture and ergonomics.  Discussed which activities to do which wants to avoid.  Discussed medication management including the meloxicam, trial anti-inflammatories, muscle relaxer.

## 2020-03-07 NOTE — Progress Notes (Signed)
Subjective:    Patient ID: Patricia [name], female    DOB: [birth year], 29 y.o.   MRN: [id number]  HPI She is here for a physical exam.   There have been no changes in her health since she was here last.  She has no concerns.  Medications and allergies reviewed with patient and updated if appropriate.  Patient Active Problem List   Diagnosis Date Noted  . Nonallopathic lesion of sacral region 10/26/2019  . Ankle fracture, right 06/27/2019  . Lumbar paraspinal muscle spasm 05/20/2018  . Nonallopathic lesion of lumbosacral region 05/20/2018  . Nonallopathic lesion of cervical region 10/21/2017  . Lipoma of axilla, right 10/01/2017  . Thyroid nodule 10/01/2017  . Hx of skin cancer, basal cell 01/22/2017  . Hypothyroidism 01/22/2017  . 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 Nonallopathic lesion of rib cage 12/31/2016  . Nonallopathic lesion of thoracic region 12/31/2016  . Back pain, chronic 10/31/2013    Current Outpatient Medications on File Prior to Visit  Medication Sig Dispense Refill  . Adapalene 0.3 % gel Apply topically to face at nigh 45 g 5  . BEYAZ 3-0.02-0.451 MG tablet     . clindamycin (CLEOCIN T) 1 % external solution Apply topically 2 (two) times daily. 30 mL 5  . levothyroxine (SYNTHROID) 75 MCG tablet TAKE 1 TABLET BY MOUTH ONCE A DAY BEFORE BREAKFAST EXCEPT FOR TWICE A WEEK TAKE 2 TABLETS 114 tablet 1  . meloxicam (MOBIC) 15 MG tablet Take 1 tablet (15 mg total) by mouth daily. 30 tablet 0  . tiZANidine (ZANAFLEX) 4 MG tablet Take 1 tablet (4 mg total) by mouth at bedtime. 30 tablet 0  . Vitamin D, Ergocalciferol, (DRISDOL) 1.25 MG (50000 UNIT) CAPS capsule TAKE 1 CAPSULE (50,000 UNITS TOTAL) BY MOUTH EVERY 7 (SEVEN) DAYS. 12 capsule 0   No current facility-administered medications on file prior to visit.    Past Medical History:  Diagnosis Date  . Chicken pox   . Thyroid disease     Past Surgical History:  Procedure Laterality Date  . MOHS SURGERY  2017    nose  . WISDOM TOOTH EXTRACTION      Social History   Socioeconomic History  . Marital status: Single    Spouse name: Not on file  . Number of children: 0  . Years of education: 42  . Highest education level: Not on file  Occupational History  . Occupation: CMA    Employer: [employer]  Tobacco Use  . Smoking status: Never Smoker  . Smokeless tobacco: Never Used  Substance and Sexual Activity  . Alcohol use: Yes    Alcohol/week: 3.0 standard drinks    Types: 1 Glasses of wine, 1 Cans of beer, 1 Shots of liquor per week  . Drug use: No  . Sexual activity: Yes    Birth control/protection: Pill  Other Topics Concern  . Not on file  Social History Narrative   Ms. Danker lives with her father & grandmother.    Denies abuse and feels safe at home.    Social Determinants of Health   Financial Resource Strain:   . Difficulty of Paying Living Expenses:   Food Insecurity:   . Worried About Charity fundraiser in the Last Year:   . Arboriculturist in the Last Year:   Transportation Needs:   . Film/video editor (Medical):   Marland Kitchen Lack of Transportation (Non-Medical):   Physical Activity:   .  Days of Exercise per Week:   . Minutes of Exercise per Session:   Stress:   . Feeling of Stress :   Social Connections:   . Frequency of Communication with Friends and Family:   . Frequency of Social Gatherings with Friends and Family:   . Attends Religious Services:   . Active Member of Clubs or Organizations:   . Attends Archivist Meetings:   Marland Kitchen Marital Status:     Family History  Problem Relation Age of Onset  . Arthritis Father   . Hypertension Father   . Thyroid disease Father   . Diabetes Father   . Depression Mother   . Thyroid disease Sister   . Healthy Maternal Grandmother   . Diabetes Maternal Grandfather   . Hypertension Maternal Grandfather   . COPD Maternal Grandfather   . Hypertension Paternal Grandmother   . Diabetes Paternal  Grandfather     Review of Systems  Constitutional: Negative for chills and fever.  Eyes: Negative for visual disturbance.  Respiratory: Negative for cough, shortness of breath and wheezing.   Cardiovascular: Negative for chest pain, palpitations and leg swelling.  Gastrointestinal: Negative for abdominal pain, blood in stool, constipation, diarrhea and nausea.  Genitourinary: Negative for dysuria and hematuria.  Musculoskeletal: Positive for back pain.       Ankle pain from healing fracture  Skin: Negative for color change and rash.  Neurological: Negative for light-headedness and headaches.  Psychiatric/Behavioral: Negative for dysphoric mood. The patient is not nervous/anxious.        Objective:   Vitals:   03/08/20 1015  BP: 118/76  Pulse: 72  Resp: 16  Temp: 98.6 F (37 C)  SpO2: 99%   Filed Weights   03/08/20 1015  Weight: 149 lb (67.6 kg)   Body mass index is 24.05 kg/m.  BP Readings from Last 3 Encounters:  03/08/20 118/76  02/28/20 100/70  01/12/20 100/62    Wt Readings from Last 3 Encounters:  03/08/20 149 lb (67.6 kg)  01/12/20 148 lb (67.1 kg)  10/26/19 144 lb (65.3 kg)     Physical Exam Constitutional: She appears well-developed and well-nourished. No distress.  HENT:  Head: Normocephalic and atraumatic.  Right Ear: External ear normal. Normal ear canal and TM Left Ear: External ear normal.  Normal ear canal and TM Mouth/Throat: Oropharynx is clear and moist.  Eyes: Conjunctivae and EOM are normal.  Neck: Neck supple. No tracheal deviation present. No thyromegaly present.  No carotid bruit  Cardiovascular: Normal rate, regular rhythm and normal heart sounds.   No murmur heard.  No edema. Pulmonary/Chest: Effort normal and breath sounds normal. No respiratory distress. She has no wheezes. She has no rales.  Breast: deferred   Abdominal: Soft. She exhibits no distension. There is no tenderness.  Lymphadenopathy: She has no cervical adenopathy.    Skin: Skin is warm and dry. She is not diaphoretic.  Psychiatric: She has a normal mood and affect. Her behavior is normal.        Assessment & Plan:   Physical exam: Screening blood work    ordered Immunizations   ? Covid, Up to date  Gyn  Up to date   Eye exams up-to-date Exercise very active Weight  Normal BMI Substance abuse  none  See Problem List for Assessment and Plan of chronic medical problems.    This visit occurred during the SARS-CoV-2 public health emergency.  Safety protocols were in place, including screening questions prior to the  visit, additional usage of staff PPE, and extensive cleaning of exam room while observing appropriate contact time as indicated for disinfecting solutions.

## 2020-03-07 NOTE — Patient Instructions (Addendum)
Blood work was ordered.    All other Health Maintenance issues reviewed.   All recommended immunizations and age-appropriate screenings are up-to-date or discussed.  No immunization administered today.   Medications reviewed and updated.  Changes include :   none     Please followup in 1 year    Health Maintenance, Female Adopting a healthy lifestyle and getting preventive care are important in promoting health and wellness. Ask your health care provider about:  The right schedule for you to have regular tests and exams.  Things you can do on your own to prevent diseases and keep yourself healthy. What should I know about diet, weight, and exercise? Eat a healthy diet   Eat a diet that includes plenty of vegetables, fruits, low-fat dairy products, and lean protein.  Do not eat a lot of foods that are high in solid fats, added sugars, or sodium. Maintain a healthy weight Body mass index (BMI) is used to identify weight problems. It estimates body fat based on height and weight. Your health care provider can help determine your BMI and help you achieve or maintain a healthy weight. Get regular exercise Get regular exercise. This is one of the most important things you can do for your health. Most adults should:  Exercise for at least 150 minutes each week. The exercise should increase your heart rate and make you sweat (moderate-intensity exercise).  Do strengthening exercises at least twice a week. This is in addition to the moderate-intensity exercise.  Spend less time sitting. Even light physical activity can be beneficial. Watch cholesterol and blood lipids Have your blood tested for lipids and cholesterol at 29 years of age, then have this test every 5 years. Have your cholesterol levels checked more often if:  Your lipid or cholesterol levels are high.  You are older than 29 years of age.  You are at high risk for heart disease. What should I know about cancer  screening? Depending on your health history and family history, you may need to have cancer screening at various ages. This may include screening for:  Breast cancer.  Cervical cancer.  Colorectal cancer.  Skin cancer.  Lung cancer. What should I know about heart disease, diabetes, and high blood pressure? Blood pressure and heart disease  High blood pressure causes heart disease and increases the risk of stroke. This is more likely to develop in people who have high blood pressure readings, are of African descent, or are overweight.  Have your blood pressure checked: ? Every 3-5 years if you are 18-39 years of age. ? Every year if you are 40 years old or older. Diabetes Have regular diabetes screenings. This checks your fasting blood sugar level. Have the screening done:  Once every three years after age 40 if you are at a normal weight and have a low risk for diabetes.  More often and at a younger age if you are overweight or have a high risk for diabetes. What should I know about preventing infection? Hepatitis B If you have a higher risk for hepatitis B, you should be screened for this virus. Talk with your health care provider to find out if you are at risk for hepatitis B infection. Hepatitis C Testing is recommended for:  Everyone born from 1945 through 1965.  Anyone with known risk factors for hepatitis C. Sexually transmitted infections (STIs)  Get screened for STIs, including gonorrhea and chlamydia, if: ? You are sexually active and are younger than 29 years   of age. ? You are older than 29 years of age and your health care provider tells you that you are at risk for this type of infection. ? Your sexual activity has changed since you were last screened, and you are at increased risk for chlamydia or gonorrhea. Ask your health care provider if you are at risk.  Ask your health care provider about whether you are at high risk for HIV. Your health care provider may  recommend a prescription medicine to help prevent HIV infection. If you choose to take medicine to prevent HIV, you should first get tested for HIV. You should then be tested every 3 months for as long as you are taking the medicine. Pregnancy  If you are about to stop having your period (premenopausal) and you may become pregnant, seek counseling before you get pregnant.  Take 400 to 800 micrograms (mcg) of folic acid every day if you become pregnant.  Ask for birth control (contraception) if you want to prevent pregnancy. Osteoporosis and menopause Osteoporosis is a disease in which the bones lose minerals and strength with aging. This can result in bone fractures. If you are 65 years old or older, or if you are at risk for osteoporosis and fractures, ask your health care provider if you should:  Be screened for bone loss.  Take a calcium or vitamin D supplement to lower your risk of fractures.  Be given hormone replacement therapy (HRT) to treat symptoms of menopause. Follow these instructions at home: Lifestyle  Do not use any products that contain nicotine or tobacco, such as cigarettes, e-cigarettes, and chewing tobacco. If you need help quitting, ask your health care provider.  Do not use street drugs.  Do not share needles.  Ask your health care provider for help if you need support or information about quitting drugs. Alcohol use  Do not drink alcohol if: ? Your health care provider tells you not to drink. ? You are pregnant, may be pregnant, or are planning to become pregnant.  If you drink alcohol: ? Limit how much you use to 0-1 drink a day. ? Limit intake if you are breastfeeding.  Be aware of how much alcohol is in your drink. In the [place] one drink equals one 12 oz bottle of beer (355 mL), one 5 oz glass of wine (148 mL), or one 1 oz glass of hard liquor (44 mL). General instructions  Schedule regular health, dental, and eye exams.  Stay current with your  vaccines.  Tell your health care provider if: ? You often feel depressed. ? You have ever been abused or do not feel safe at home. Summary  Adopting a healthy lifestyle and getting preventive care are important in promoting health and wellness.  Follow your health care provider's instructions about healthy diet, exercising, and getting tested or screened for diseases.  Follow your health care provider's instructions on monitoring your cholesterol and blood pressure. This information is not intended to replace advice given to you by your health care provider. Make sure you discuss any questions you have with your health care provider. Document Revised: 10/13/2018 Document Reviewed: 10/13/2018 Elsevier Patient Education  2020 [employer]  

## 2020-03-08 ENCOUNTER — Other Ambulatory Visit: Payer: Self-pay

## 2020-03-08 ENCOUNTER — Ambulatory Visit (INDEPENDENT_AMBULATORY_CARE_PROVIDER_SITE_OTHER): Payer: No Typology Code available for payment source | Admitting: Internal Medicine

## 2020-03-08 ENCOUNTER — Encounter: Payer: Self-pay | Admitting: Internal Medicine

## 2020-03-08 VITALS — BP 118/76 | HR 72 | Temp 98.6°F | Resp 16 | Ht 66.0 in | Wt 149.0 lb

## 2020-03-08 DIAGNOSIS — Z0000 Encounter for general adult medical examination without abnormal findings: Secondary | ICD-10-CM

## 2020-03-08 DIAGNOSIS — Z111 Encounter for screening for respiratory tuberculosis: Secondary | ICD-10-CM | POA: Diagnosis not present

## 2020-03-08 DIAGNOSIS — E039 Hypothyroidism, unspecified: Secondary | ICD-10-CM

## 2020-03-08 LAB — CBC WITH DIFFERENTIAL/PLATELET
Basophils Absolute: 0 10*3/uL (ref 0.0–0.1)
Basophils Relative: 0.7 % (ref 0.0–3.0)
Eosinophils Absolute: 0.2 10*3/uL (ref 0.0–0.7)
Eosinophils Relative: 3.1 % (ref 0.0–5.0)
HCT: 38.7 % (ref 36.0–46.0)
Hemoglobin: 13.1 g/dL (ref 12.0–15.0)
Lymphocytes Relative: 27.7 % (ref 12.0–46.0)
Lymphs Abs: 1.8 10*3/uL (ref 0.7–4.0)
MCHC: 33.9 g/dL (ref 30.0–36.0)
MCV: 87.9 fl (ref 78.0–100.0)
Monocytes Absolute: 0.6 10*3/uL (ref 0.1–1.0)
Monocytes Relative: 8.4 % (ref 3.0–12.0)
Neutro Abs: 4 10*3/uL (ref 1.4–7.7)
Neutrophils Relative %: 60.1 % (ref 43.0–77.0)
Platelets: 269 10*3/uL (ref 150.0–400.0)
RBC: 4.4 Mil/uL (ref 3.87–5.11)
RDW: 12.7 % (ref 11.5–15.5)
WBC: 6.7 10*3/uL (ref 4.0–10.5)

## 2020-03-08 LAB — COMPREHENSIVE METABOLIC PANEL
ALT: 11 U/L (ref 0–35)
AST: 17 U/L (ref 0–37)
Albumin: 4.1 g/dL (ref 3.5–5.2)
Alkaline Phosphatase: 50 U/L (ref 39–117)
BUN: 11 mg/dL (ref 6–23)
CO2: 24 mEq/L (ref 19–32)
Calcium: 9.1 mg/dL (ref 8.4–10.5)
Chloride: 106 mEq/L (ref 96–112)
Creatinine, Ser: 0.73 mg/dL (ref 0.40–1.20)
GFR: 94.16 mL/min (ref >60.00)
Glucose, Bld: 94 mg/dL (ref 70–99)
Potassium: 4.1 mEq/L (ref 3.5–5.1)
Sodium: 139 mEq/L (ref 135–145)
Total Bilirubin: 0.5 mg/dL (ref 0.2–1.2)
Total Protein: 7.1 g/dL (ref 6.0–8.3)

## 2020-03-08 LAB — LIPID PANEL
Cholesterol: 183 mg/dL (ref 0–200)
HDL: 75.7 mg/dL (ref >39.00)
LDL Cholesterol: 91 mg/dL (ref 0–99)
NonHDL: 107.69
Total CHOL/HDL Ratio: 2
Triglycerides: 83 mg/dL (ref 0.0–149.0)
VLDL: 16.6 mg/dL (ref 0.0–40.0)

## 2020-03-08 LAB — TSH: TSH: 0.34 u[IU]/mL — ABNORMAL LOW (ref 0.35–4.50)

## 2020-03-08 MED ORDER — VITAMIN D 50 MCG (2000 UT) PO CAPS: 2000.0000 [IU] | ORAL_CAPSULE | Freq: Every day | ORAL | Status: DC

## 2020-03-08 NOTE — Assessment & Plan Note (Signed)
Chronic  Clinically euthyroid Check tsh  Titrate med dose if needed  

## 2020-03-10 LAB — QUANTIFERON-TB GOLD PLUS
Mitogen-NIL: 8.49 IU/mL
NIL: 0.05 IU/mL
QuantiFERON-TB Gold Plus: NEGATIVE
TB1-NIL: 0.01 IU/mL
TB2-NIL: 0.01 IU/mL

## 2020-03-21 MED FILL — DROSPIREN-ETH ESTRAD-LEVOME: 3-0.02-0.45 | 70 days supply | Qty: 84 | Fill #0

## 2020-04-03 MED FILL — LEVOTHYROXINE 75 MCG TABLET: 75 | 90 days supply | Qty: 114 | Fill #1

## 2020-04-04 ENCOUNTER — Other Ambulatory Visit: Payer: Self-pay

## 2020-04-04 DIAGNOSIS — E039 Hypothyroidism, unspecified: Secondary | ICD-10-CM

## 2020-04-13 ENCOUNTER — Other Ambulatory Visit (INDEPENDENT_AMBULATORY_CARE_PROVIDER_SITE_OTHER): Payer: No Typology Code available for payment source

## 2020-04-13 ENCOUNTER — Ambulatory Visit (INDEPENDENT_AMBULATORY_CARE_PROVIDER_SITE_OTHER): Payer: No Typology Code available for payment source | Admitting: Family Medicine

## 2020-04-13 ENCOUNTER — Encounter: Payer: Self-pay | Admitting: Family Medicine

## 2020-04-13 ENCOUNTER — Other Ambulatory Visit: Payer: Self-pay

## 2020-04-13 VITALS — BP 110/78 | HR 77 | Wt 151.0 lb

## 2020-04-13 DIAGNOSIS — M940 Chondrocostal junction syndrome [Tietze]: Secondary | ICD-10-CM | POA: Diagnosis not present

## 2020-04-13 DIAGNOSIS — E039 Hypothyroidism, unspecified: Secondary | ICD-10-CM

## 2020-04-13 DIAGNOSIS — M999 Biomechanical lesion, unspecified: Secondary | ICD-10-CM | POA: Diagnosis not present

## 2020-04-13 LAB — TSH: TSH: 2.48 u[IU]/mL (ref 0.35–4.50)

## 2020-04-13 NOTE — Assessment & Plan Note (Signed)
Chronic problem with exacerbation.  Seems to be exacerbated with stress.  Has been working out on a more regular basis and is doing better but still has some muscle imbalances that is continuing to give him difficulty.  Patient is to increase activity slowly.  Icing regimen.  Follow-up again 4 to 8 weeks.

## 2020-04-13 NOTE — Progress Notes (Signed)
[facility] [address] [place] Phone: [phone] Subjective:    I'm seeing this patient by the request  of:  Burns, Claudina Lick, MD   I, Judy Pimple, am serving as a scribe for Dr. Hulan Saas.  CC: Back and neck pain follow-up  [id number]  Tricha Ruggirello is a 29 y.o. female coming in with complaint of back and neck pain. OMT 02/28/2020. Patient states that her rib needs to be popped back into place, that her back his hurting again this morning.   Medications patient has been prescribed: Meloxicam, Zanaflex          Reviewed prior external information including notes and imaging from previsou exam, outside providers and external EMR if available.   As well as notes that were available from care everywhere and other healthcare systems.  Past medical history, social, surgical and family history all reviewed in electronic medical record.  No pertanent information unless stated regarding to the chief complaint.   Past Medical History:  Diagnosis Date  . Chicken pox   . Thyroid disease     Allergies  Allergen Reactions  . Amoxicillin Nausea And Vomiting     Review of Systems:  No headache, visual changes, nausea, vomiting, diarrhea, constipation, dizziness, abdominal pain, skin rash, fevers, chills, night sweats, weight loss, swollen lymph nodes, body aches, joint swelling, chest pain, shortness of breath, mood changes. POSITIVE muscle aches  Objective  Blood pressure 110/78, pulse 77, weight 151 lb (68.5 kg), SpO2 98 %.   General: No apparent distress alert and oriented x3 mood and affect normal, dressed appropriately.  HEENT: Pupils equal, extraocular movements intact  Respiratory: Patient's speak in full sentences and does not appear short of breath  Cardiovascular: No lower extremity edema, non tender, no erythema  Neuro: Cranial nerves II through XII are intact, neurovascularly intact in all extremities with  2+ DTRs and 2+ pulses.  Gait normal with good balance and coordination.  MSK:  Non tender with full range of motion and good stability and symmetric strength and tone of shoulders, elbows, wrist, hip, knee and ankles bilaterally.  Back -tenderness of the low back noted.  Patient is some tenderness to palpation in the paraspinal musculature of the lumbar spine.  Patient does have tightness with FABER test.  Patient does have some mild pain in the parascapular region as well.  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  Seems to be exacerbated with stress.  Has been working out on a more regular basis and is doing better but still has some muscle imbalances that is continuing to give him difficulty.  Patient is to increase activity slowly.  Icing regimen.  Follow-up again 4 to 8 weeks.    Nonallopathic problems  Decision today to treat with OMT was based on Physical Exam  After verbal consent patient was treated with HVLA, ME, FPR techniques in cervical, rib, thoracic, lumbar, and sacral  areas  Patient tolerated the procedure well with improvement in symptoms  Patient given exercises, stretches and lifestyle modifications  See medications in patient instructions if given  Patient will follow up in 4-8 weeks  The above documentation has been reviewed and is accurate and complete Lyndal Pulley, DO       Note: This dictation was prepared with Dragon dictation along with smaller phrase technology. Any transcriptional errors that result from this process are unintentional.

## 2020-04-16 ENCOUNTER — Other Ambulatory Visit: Payer: Self-pay | Admitting: Internal Medicine

## 2020-04-16 DIAGNOSIS — E063 Autoimmune thyroiditis: Secondary | ICD-10-CM

## 2020-04-16 DIAGNOSIS — E038 Other specified hypothyroidism: Secondary | ICD-10-CM

## 2020-04-16 MED ORDER — LEVOTHYROXINE SODIUM 75 MCG PO TABS: ORAL_TABLET | ORAL | 1 refills | Status: DC

## 2020-05-15 ENCOUNTER — Ambulatory Visit: Payer: No Typology Code available for payment source | Admitting: Family Medicine

## 2020-05-15 NOTE — Progress Notes (Deleted)
  [place] Phone: [phone] Subjective:    I'm seeing this patient by the request  of:  Binnie Rail, MD  CC:   [id number]  Patricia [name] is a 29 y.o. female coming in with complaint of back and neck pain. OMT 04/13/2020. Patient states   Medications patient has been prescribed:   Taking:         Reviewed prior external information including notes and imaging from previsou exam, outside providers and external EMR if available.   As well as notes that were available from care everywhere and other healthcare systems.  Past medical history, social, surgical and family history all reviewed in electronic medical record.  No pertanent information unless stated regarding to the chief complaint.   Past Medical History:  Diagnosis Date  . Chicken pox   . Thyroid disease     Allergies  Allergen Reactions  . Amoxicillin Nausea And Vomiting     Review of Systems:  No headache, visual changes, nausea, vomiting, diarrhea, constipation, dizziness, abdominal pain, skin rash, fevers, chills, night sweats, weight loss, swollen lymph nodes, body aches, joint swelling, chest pain, shortness of breath, mood changes. POSITIVE muscle aches  Objective  There were no vitals taken for this visit.   General: No apparent distress alert and oriented x3 mood and affect normal, dressed appropriately.  HEENT: Pupils equal, extraocular movements intact  Respiratory: Patient's speak in full sentences and does not appear short of breath  Cardiovascular: No lower extremity edema, non tender, no erythema  Neuro: Cranial nerves II through XII are intact, neurovascularly intact in all extremities with 2+ DTRs and 2+ pulses.  Gait normal with good balance and coordination.  MSK:  Non tender with full range of motion and good stability and symmetric strength and tone of shoulders, elbows, wrist, hip, knee and ankles  bilaterally.  Back - Normal skin, Spine with normal alignment and no deformity.  No tenderness to vertebral process palpation.  Paraspinous muscles are not tender and without spasm.   Range of motion is full at neck and lumbar sacral regions  Osteopathic findings  C2 flexed rotated and side bent right C6 flexed rotated and side bent left T3 extended rotated and side bent right inhaled rib T9 extended rotated and side bent left L2 flexed rotated and side bent right Sacrum right on right       Assessment and Plan:    Nonallopathic problems  Decision today to treat with OMT was based on Physical Exam  After verbal consent patient was treated with HVLA, ME, FPR techniques in cervical, rib, thoracic, lumbar, and sacral  areas  Patient tolerated the procedure well with improvement in symptoms  Patient given exercises, stretches and lifestyle modifications  See medications in patient instructions if given  Patient will follow up in 4-8 weeks      The above documentation has been reviewed and is accurate and complete Jacqualin Combes       Note: This dictation was prepared with Dragon dictation along with smaller phrase technology. Any transcriptional errors that result from this process are unintentional.

## 2020-06-01 ENCOUNTER — Ambulatory Visit (INDEPENDENT_AMBULATORY_CARE_PROVIDER_SITE_OTHER): Payer: No Typology Code available for payment source | Admitting: Family Medicine

## 2020-06-01 ENCOUNTER — Other Ambulatory Visit: Payer: Self-pay

## 2020-06-01 ENCOUNTER — Encounter: Payer: Self-pay | Admitting: Family Medicine

## 2020-06-01 VITALS — BP 110/80 | HR 70 | Ht 66.0 in | Wt 150.0 lb

## 2020-06-01 DIAGNOSIS — M999 Biomechanical lesion, unspecified: Secondary | ICD-10-CM | POA: Diagnosis not present

## 2020-06-01 DIAGNOSIS — M940 Chondrocostal junction syndrome [Tietze]: Secondary | ICD-10-CM | POA: Diagnosis not present

## 2020-06-01 MED ORDER — TIZANIDINE HCL 4 MG PO TABS
4.0000 mg | ORAL_TABLET | Freq: Every day | ORAL | 1 refills | Status: DC
Start: 2020-06-01 — End: 2021-06-11

## 2020-06-01 MED FILL — metroNIDAZOLE 1 % GEL: 1 | 30 days supply | Qty: 60 | Fill #1

## 2020-06-01 MED FILL — tiZANidine HCL 4 MG TABS: 4 | 30 days supply | Qty: 30 | Fill #0

## 2020-06-01 NOTE — Progress Notes (Signed)
[facility] [address] [place] [address] [place] Phone: [phone] Subjective:   I Patricia [name] am serving as a Education administrator for Dr. Hulan [name].  This visit occurred during the SARS-CoV-2 public health emergency.  Safety protocols were in place, including screening questions prior to the visit, additional usage of staff PPE, and extensive cleaning of exam room while observing appropriate contact time as indicated for disinfecting solutions.   I'm seeing this patient by the request  of:  Patricia Rail, MD  CC: Back and neck pain follow-up  [id number]  Patricia [name] is a 29 y.o. female coming in with complaint of back and neck pain. OMT 04/13/2020. Patient states States she is doing well. Last week she was painful in the lower back. Zanaflex refill.  Patient will be changing to a daytime shift that she thinks will be more of a balance to allow her to work out and do the exercises more regularly.  Medications patient has been prescribed: Zanaflex and taking intermittently          Reviewed prior external information including notes and imaging from previsou exam, outside providers and external EMR if available.   As well as notes that were available from care everywhere and other healthcare systems.  Past medical history, social, surgical and family history all reviewed in electronic medical record.  No pertanent information unless stated regarding to the chief complaint.   Past Medical History:  Diagnosis Date  . Chicken pox   . Thyroid disease     Allergies  Allergen Reactions  . Amoxicillin Nausea And Vomiting     Review of Systems:  No headache, visual changes, nausea, vomiting, diarrhea, constipation, dizziness, abdominal pain, skin rash, fevers, chills, night sweats, weight loss, swollen lymph nodes, body aches, joint swelling, chest pain, shortness of breath, mood changes. POSITIVE muscle aches  Objective  Blood pressure 110/80,  pulse 70, height 5\' 6"  (1.676 m), weight 150 lb (68 kg), SpO2 97 %.   General: No apparent distress alert and oriented x3 mood and affect normal, dressed appropriately.  HEENT: Pupils equal, extraocular movements intact  Respiratory: Patient's speak in full sentences and does not appear short of breath  Cardiovascular: No lower extremity edema, non tender, no erythema  Neuro: Cranial nerves II through XII are intact, neurovascularly intact in all extremities with 2+ DTRs and 2+ pulses.  Gait normal with good balance and coordination.  MSK:  Non tender with full range of motion and good stability and symmetric strength and tone of shoulders, elbows, wrist, hip, knee and ankles bilaterally.  Back -back exam does have some mild loss of lordosis.  Tightness in the thoracolumbar juncture.  Mild tightness noted in the cervical thoracic as well.  Osteopathic findings  C2 flexed rotated and side bent right T3 extended rotated and side bent right inhaled rib T8 extended rotated and side bent left L2 flexed rotated and side bent right Sacrum right on right       Assessment and Plan:  Slipped rib syndrome Seem to be more concerning on the right side today.  Discussed posture ergonomics, discussed which activities to do which wants to avoid.  Patient is to increase activity slowly over the course the next several weeks.  Patient is going to be changing her job here soon and likely that will make some difference changes as well.  Follow-up again in 4 to 8 weeks    Nonallopathic problems  Decision today to treat with OMT  was based on Physical Exam  After verbal consent patient was treated with HVLA, ME, FPR techniques in cervical, rib, thoracic, lumbar, and sacral  areas  Patient tolerated the procedure well with improvement in symptoms  Patient given exercises, stretches and lifestyle modifications  See medications in patient instructions if given  Patient will follow up in 4-8 weeks        The above documentation has been reviewed and is accurate and complete Patricia Pulley, DO       Note: This dictation was prepared with Dragon dictation along with smaller phrase technology. Any transcriptional errors that result from this process are unintentional.

## 2020-06-01 NOTE — Assessment & Plan Note (Signed)
Seem to be more concerning on the right side today.  Discussed posture ergonomics, discussed which activities to do which wants to avoid.  Patient is to increase activity slowly over the course the next several weeks.  Patient is going to be changing her job here soon and likely that will make some difference changes as well.  Follow-up again in 4 to 8 weeks

## 2020-06-05 MED FILL — DROSPIREN-ETH ESTRAD-LEVOME: 3-0.02-0.45 | 70 days supply | Qty: 84 | Fill #1

## 2020-07-04 NOTE — Progress Notes (Signed)
[place] Phone: [phone] Subjective:   Fontaine No, am serving as a scribe for Dr. Hulan Saas. This visit occurred during the SARS-CoV-2 public health emergency.  Safety protocols were in place, including screening questions prior to the visit, additional usage of staff PPE, and extensive cleaning of exam room while observing appropriate contact time as indicated for disinfecting solutions.   I'm seeing this patient by the request  of:  Binnie Rail, MD  CC: Low back pain  [id number]  Patricia [name] is a 29 y.o. female coming in with complaint of back and neck pain. OMT 06/01/2020. Patient states that she is having left leg numbness near distal ITB.  Patient is starting a new job and is starting to increase the amount of time she is standing.  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  Medications patient has been prescribed: Meloxicam and Zanaflex  Taking: Yes intermittently         Reviewed prior external information including notes and imaging from previsou exam, outside providers and external EMR if available.   As well as notes that were available from care everywhere and other healthcare systems.  Past medical history, social, surgical and family history all reviewed in electronic medical record.  No pertanent information unless stated regarding to the chief complaint.   Past Medical History:  Diagnosis Date  . Chicken pox   . Thyroid disease     Allergies  Allergen Reactions  . Amoxicillin Nausea And Vomiting     Review of Systems:  No headache, visual changes, nausea, vomiting, diarrhea, constipation, dizziness, abdominal pain, skin rash, fevers, chills, night sweats, weight loss, swollen lymph nodes, body aches, joint swelling, chest pain, shortness of breath, mood changes. POSITIVE muscle  aches  Objective  Blood pressure 110/78, pulse 98, height 5\' 6"  (1.676 m), weight 150 lb (68 kg), SpO2 99 %.   General: No apparent distress alert and oriented x3 mood and affect normal, dressed appropriately.  HEENT: Pupils equal, extraocular movements intact  Respiratory: Patient's speak in full sentences and does not appear short of breath  Cardiovascular: No lower extremity edema, non tender, no erythema  Neuro: Cranial nerves II through XII are intact, neurovascularly intact in all extremities with 2+ DTRs and 2+ pulses.  Gait normal with good balance and coordination.  MSK:  Non tender with full range of motion and good stability and symmetric strength and tone of shoulders, elbows, wrist, hip, knee and ankles bilaterally.  Back -more tightness on the left side of the paraspinal musculature of the lumbar spine.  Tender to palpation diffusely.  Negative straight leg test.  Mild lack of extension of 5 degrees.  Osteopathic findings  C3 flexed rotated and side bent right T6 extended rotated and side bent right inhaled rib T7 extended rotated and side bent right  L2 flexed rotated and side bent right Sacrum right on right       Assessment and Plan:   Back pain, chronic Chronic problem with mild exacerbation.  Discussed the medications including the Zanaflex and the meloxicam.  Responding fairly well to the osteopathic manipulation.  Patient is seen another provider and will have some custom orthotics made in the near future.  Patient will increase activity slowly otherwise.  Follow-up with me again in 4 to 8 weeks   Nonallopathic  problems  Decision today to treat with OMT was based on Physical Exam  After verbal consent patient was treated with HVLA, ME, FPR techniques in cervical, rib, thoracic, lumbar, and sacral  areas  Patient tolerated the procedure well with improvement in symptoms  Patient given exercises, stretches and lifestyle modifications  See medications in  patient instructions if given  Patient will follow up in 4-8 weeks      The above documentation has been reviewed and is accurate and complete Lyndal Pulley, DO       Note: This dictation was prepared with Dragon dictation along with smaller phrase technology. Any transcriptional errors that result from this process are unintentional.

## 2020-07-05 ENCOUNTER — Ambulatory Visit (INDEPENDENT_AMBULATORY_CARE_PROVIDER_SITE_OTHER): Payer: No Typology Code available for payment source | Admitting: Family Medicine

## 2020-07-05 ENCOUNTER — Encounter: Payer: Self-pay | Admitting: Family Medicine

## 2020-07-05 ENCOUNTER — Other Ambulatory Visit: Payer: Self-pay

## 2020-07-05 VITALS — BP 110/78 | HR 98 | Ht 66.0 in | Wt 150.0 lb

## 2020-07-05 DIAGNOSIS — M545 Low back pain, unspecified: Secondary | ICD-10-CM

## 2020-07-05 DIAGNOSIS — G8929 Other chronic pain: Secondary | ICD-10-CM | POA: Diagnosis not present

## 2020-07-05 DIAGNOSIS — M999 Biomechanical lesion, unspecified: Secondary | ICD-10-CM | POA: Diagnosis not present

## 2020-07-05 NOTE — Assessment & Plan Note (Signed)
Chronic problem with mild exacerbation.  Discussed the medications including the Zanaflex and the meloxicam.  Responding fairly well to the osteopathic manipulation.  Patient is seen another provider and will have some custom orthotics made in the near future.  Patient will increase activity slowly otherwise.  Follow-up with me again in 4 to 8 weeks

## 2020-07-16 ENCOUNTER — Other Ambulatory Visit: Payer: Self-pay | Admitting: Family Medicine

## 2020-07-16 ENCOUNTER — Other Ambulatory Visit: Payer: Self-pay | Admitting: Internal Medicine

## 2020-07-16 DIAGNOSIS — E038 Other specified hypothyroidism: Secondary | ICD-10-CM

## 2020-07-16 MED FILL — LEVOTHYROXINE 75 MCG TABLET: 75 | 90 days supply | Qty: 114 | Fill #0

## 2020-07-17 MED FILL — MELOXICAM 15 MG TABLET: 15 | 30 days supply | Qty: 30 | Fill #0

## 2020-07-19 ENCOUNTER — Other Ambulatory Visit: Payer: Self-pay | Admitting: Family Medicine

## 2020-07-19 MED ORDER — ALBUTEROL SULFATE HFA 108 (90 BASE) MCG/ACT IN AERS: 1.0000 | INHALATION_SPRAY | Freq: Four times a day (QID) | RESPIRATORY_TRACT | 1 refills | Status: DC | PRN

## 2020-07-19 MED FILL — ALBUTEROL SULFATE HFA 108 (: 108 (90 BAS | 25 days supply | Qty: 9 | Fill #0

## 2020-08-20 MED FILL — DROSPIREN-ETH ESTRAD-LEVOME: 3-0.02-0.45 | 70 days supply | Qty: 84 | Fill #2

## 2020-10-24 ENCOUNTER — Other Ambulatory Visit: Payer: Self-pay | Admitting: Family Medicine

## 2020-10-24 ENCOUNTER — Ambulatory Visit (INDEPENDENT_AMBULATORY_CARE_PROVIDER_SITE_OTHER): Payer: No Typology Code available for payment source

## 2020-10-24 ENCOUNTER — Other Ambulatory Visit: Payer: Self-pay

## 2020-10-24 ENCOUNTER — Encounter: Payer: Self-pay | Admitting: Family Medicine

## 2020-10-24 ENCOUNTER — Ambulatory Visit (INDEPENDENT_AMBULATORY_CARE_PROVIDER_SITE_OTHER): Payer: No Typology Code available for payment source | Admitting: Family Medicine

## 2020-10-24 VITALS — BP 102/74 | HR 94 | Ht 66.0 in

## 2020-10-24 DIAGNOSIS — M25552 Pain in left hip: Secondary | ICD-10-CM | POA: Diagnosis not present

## 2020-10-24 DIAGNOSIS — M999 Biomechanical lesion, unspecified: Secondary | ICD-10-CM

## 2020-10-24 MED ORDER — GABAPENTIN 100 MG PO CAPS
200.0000 mg | ORAL_CAPSULE | Freq: Every day | ORAL | 3 refills | Status: DC
Start: 2020-10-24 — End: 2020-10-24

## 2020-10-24 MED FILL — LEVOTHYROXINE 75 MCG TABLET: 75 | 90 days supply | Qty: 114 | Fill #1

## 2020-10-24 MED FILL — GABAPENTIN 100 MG CAPSULE: 100 | 90 days supply | Qty: 180 | Fill #0

## 2020-10-24 NOTE — Assessment & Plan Note (Signed)
Patient does have more of the left hip pain.  Discussed with patient in great length.  Patient seems to have more of a snapping hip.  Does have chronic back pain the patient's MRI in 2019 was completely unremarkable.  Patient given new exercises, x-rays pending.  Discussed icing regimen.  Low-dose gabapentin given for nighttime relief.  Patient has had muscle relaxers and encourage patient to try to take the meloxicam during the day.  If worsening pain will need to consider advanced imaging.  Could consider the possibility of a prednisone burst as well.  Patient has noticed some increasing pain at work and we discussed potentially changing footwear.  Follow-up with me again in 4 to 6 weeks

## 2020-10-24 NOTE — Progress Notes (Signed)
[place] [facility] [place] Phone: [phone] Subjective:   Fontaine No, am serving as a scribe for Dr. Hulan Saas. This visit occurred during the SARS-CoV-2 public health emergency.  Safety protocols were in place, including screening questions prior to the visit, additional usage of staff PPE, and extensive cleaning of exam room while observing appropriate contact time as indicated for disinfecting solutions.   I'm seeing this patient by the request  of:  Binnie Rail, MD  CC: Back and hip pain  [id number]  Patricia [name] is a 29 y.o. female coming in with complaint of back and neck pain. OMT 07/05/2020. Patient states that she has been having left hip pain. Pain in anterior aspect of hip that radiates down into the left thigh. Pain with squatting and standing on her feet at work.  Patient is noticing more on the left side.  Seems to be more anterior.  Never feels like it is going to give out on her.  Describes it as a dull, throbbing aching pain.  Has been wearing newer shoes with orthotics recently.  States that the pain though have been going on before the orthotics  Medications patient has been prescribed: Meloxicam, Zanaflex  Taking: Meloxicam daily         Reviewed prior external information including notes and imaging from previsou exam, outside providers and external EMR if available.   As well as notes that were available from care everywhere and other healthcare systems.  Past medical history, social, surgical and family history all reviewed in electronic medical record.  No pertanent information unless stated regarding to the chief complaint.   Past Medical History:  Diagnosis Date  . Chicken pox   . Thyroid disease     Allergies  Allergen Reactions  . Amoxicillin Nausea And Vomiting     Review of Systems:  No headache, visual changes, nausea, vomiting, diarrhea, constipation, dizziness,  abdominal pain, skin rash, fevers, chills, night sweats, weight loss, swollen lymph nodes, body aches, joint swelling, chest pain, shortness of breath, mood changes. POSITIVE muscle aches  Objective  Blood pressure 102/74, pulse 94, height 5\' 6"  (1.676 m), last menstrual period 09/28/2020, SpO2 98 %.   General: No apparent distress alert and oriented x3 mood and affect normal, dressed appropriately.  HEENT: Pupils equal, extraocular movements intact  Respiratory: Patient's speak in full sentences and does not appear short of breath  Cardiovascular: No lower extremity edema, non tender, no erythema  Neuro: Cranial nerves II through XII are intact, neurovascularly intact in all extremities with 2+ DTRs and 2+ pulses.  Gait normal with good balance and coordination.  Back -low back exam does have some tightness noted in the paraspinal musculature left greater than right.  Mild tightness of the left FABER test.  Patient's right hip does have full range of motion.  Patient does have more tightness of the hip flexor on the left side than the right.  Tenderness to palpation over the tensor fascia lata.  Osteopathic findings  C2 flexed rotated and side bent right C6 flexed rotated and side bent left T3 extended rotated and side bent right inhaled rib T9 extended rotated and side bent left L2 flexed rotated and side bent right Sacrum left on left       Assessment and Plan:  Left hip pain Patient does have more of the left hip pain.  Discussed with patient in great length.  Patient seems to have more  of a snapping hip.  Does have chronic back pain the patient's MRI in 2019 was completely unremarkable.  Patient given new exercises, x-rays pending.  Discussed icing regimen.  Low-dose gabapentin given for nighttime relief.  Patient has had muscle relaxers and encourage patient to try to take the meloxicam during the day.  If worsening pain will need to consider advanced imaging.  Could consider the  possibility of a prednisone burst as well.  Patient has noticed some increasing pain at work and we discussed potentially changing footwear.  Follow-up with me again in 4 to 6 weeks   Nonallopathic problems  Decision today to treat with OMT was based on Physical Exam  After verbal consent patient was treated with HVLA, ME, FPR techniques in cervical, rib, thoracic, lumbar, and sacral  areas  Patient tolerated the procedure well with improvement in symptoms  Patient given exercises, stretches and lifestyle modifications  See medications in patient instructions if given  Patient will follow up in 4-8 weeks      The above documentation has been reviewed and is accurate and complete Judi Saa, DO       Note: This dictation was prepared with Dragon dictation along with smaller phrase technology. Any transcriptional errors that result from this process are unintentional.

## 2020-10-24 NOTE — Patient Instructions (Signed)
Left hip xray Gabapenin 100 mg  See me again 4-6 weeks

## 2020-10-29 MED FILL — DROSP-EE-LEVOMEF 3-0.02-0.4: 3-0.02-0.45 | 70 days supply | Qty: 84 | Fill #3

## 2020-11-21 NOTE — Progress Notes (Signed)
[place] [address] [place] Phone: [phone] Subjective:   I [name] am serving as a Education administrator for Dr. Hulan Saas.  This visit occurred during the SARS-CoV-2 public health emergency.  Safety protocols were in place, including screening questions prior to the visit, additional usage of staff PPE, and extensive cleaning of exam room while observing appropriate contact time as indicated for disinfecting solutions.   I'm seeing this patient by the request  of:  Binnie Rail, MD  CC: neck and back pain   [id number]  Patricia [name] is a 30 y.o. female coming in with complaint of back and neck pain. OMT 10/24/2020. Patient states the left hip has been bothering her. The gabapentin has been helping. Believes that working on her feet is increasing the pain. Not as much numbness and tingling. States she had some yesterday. Pain has not gotten better.    Xray of hip at last exam normal (12/22)     Reviewed prior external information including notes and imaging from previsou exam, outside providers and external EMR if available.   As well as notes that were available from care everywhere and other healthcare systems.  Past medical history, social, surgical and family history all reviewed in electronic medical record.  No pertanent information unless stated regarding to the chief complaint.   Past Medical History:  Diagnosis Date  . Chicken pox   . Thyroid disease     Allergies  Allergen Reactions  . Amoxicillin Nausea And Vomiting     Review of Systems:  No headache, visual changes, nausea, vomiting, diarrhea, constipation, dizziness, abdominal pain, skin rash, fevers, chills, night sweats, weight loss, swollen lymph nodes, body aches, joint swelling, chest pain, shortness of breath, mood changes. POSITIVE muscle aches  Objective  Blood pressure 130/80, pulse 90, height 5\' 6"  (1.676 m), weight 152 lb (68.9 kg), SpO2 98  %.   General: No apparent distress alert and oriented x3 mood and affect normal, dressed appropriately.  HEENT: Pupils equal, extraocular movements intact  Respiratory: Patient's speak in full sentences and does not appear short of breath  Cardiovascular: No lower extremity edema, non tender, no erythema  Gait normal MSK: Patient's left hip still has some very mild discomfort especially with external rotation.  Negative grind test though noted.  Patient does have mild tenderness still in the back and seems to be more in the thoracolumbar juncture.  Tightness noted right greater than left.  Neurovascular intact distally.  5 out of 5 strength otherwise.   Osteopathic findings  C2 flexed rotated and side bent right T3 extended rotated and side bent right inhaled rib T11 extended rotated and side bent left L1 flexed rotated and side bent left Sacrum right on right       Assessment and Plan: Left hip pain Seems to be more secondary to tightness of the hip flexor.  More tightness noted in the thoracolumbar juncture than usual discussed with patient about proper shoes again.  Discussed continuing the exercises at this time.  Could consider the possibility of imaging again but I do not think it would make a big difference.  Gabapentin has been helpful.  Patient knows if anything else changes such as fevers chills abnormal weight loss or dysuria which patient denied then we need to consider further work-up.  Patient will follow-up with me again 6 to 8 weeks     Nonallopathic problems  Decision today to treat with OMT was based  on Physical Exam  After verbal consent patient was treated with HVLA, ME, FPR techniques in cervical, rib, thoracic, lumbar, and sacral  areas  Patient tolerated the procedure well with improvement in symptoms  Patient given exercises, stretches and lifestyle modifications  See medications in patient instructions if given  Patient will follow up in 4-8 weeks       The above documentation has been reviewed and is accurate and complete Lyndal Pulley, DO       Note: This dictation was prepared with Dragon dictation along with smaller phrase technology. Any transcriptional errors that result from this process are unintentional.

## 2020-11-22 ENCOUNTER — Ambulatory Visit (INDEPENDENT_AMBULATORY_CARE_PROVIDER_SITE_OTHER): Payer: No Typology Code available for payment source | Admitting: Family Medicine

## 2020-11-22 ENCOUNTER — Encounter: Payer: Self-pay | Admitting: Family Medicine

## 2020-11-22 ENCOUNTER — Other Ambulatory Visit: Payer: Self-pay

## 2020-11-22 VITALS — BP 130/80 | HR 90 | Ht 66.0 in | Wt 152.0 lb

## 2020-11-22 DIAGNOSIS — M25552 Pain in left hip: Secondary | ICD-10-CM

## 2020-11-22 DIAGNOSIS — M999 Biomechanical lesion, unspecified: Secondary | ICD-10-CM | POA: Diagnosis not present

## 2020-11-22 NOTE — Patient Instructions (Addendum)
Good to see you Can take up to 3 gabapentin at night Ice after activity Focus on stretching hip flexor after activity See me again in 6-8 weeks

## 2020-11-22 NOTE — Assessment & Plan Note (Signed)
Seems to be more secondary to tightness of the hip flexor.  More tightness noted in the thoracolumbar juncture than usual discussed with patient about proper shoes again.  Discussed continuing the exercises at this time.  Could consider the possibility of imaging again but I do not think it would make a big difference.  Gabapentin has been helpful.  Patient knows if anything else changes such as fevers chills abnormal weight loss or dysuria which patient denied then we need to consider further work-up.  Patient will follow-up with me again 6 to 8 weeks

## 2020-12-17 ENCOUNTER — Other Ambulatory Visit (HOSPITAL_COMMUNITY): Payer: Self-pay | Admitting: Gynecology

## 2020-12-31 MED FILL — DROSP-EE-LEVOMEF 3-0.02-0.4: 3-0.02-0.45 | 72 days supply | Qty: 84 | Fill #0

## 2021-01-03 NOTE — Progress Notes (Signed)
Patricia [name] Patricia [name] Patricia [name] Phone: [phone] Subjective:   Patricia [name], am serving as a scribe for Dr. Hulan Saas. This visit occurred during the SARS-CoV-2 public health emergency.  Safety protocols were in place, including screening questions prior to the visit, additional usage of staff PPE, and extensive cleaning of exam room while observing appropriate contact time as indicated for disinfecting solutions.   I'm seeing this patient by the request  of:  Binnie Rail, MD  CC: Low back pain follow-up  [id number]  Patricia [name] is a 30 y.o. female coming in with complaint of back and neck pain. OMT 11/22/2020. Patient states that the numbness and tingling in left leg has decreased. Is finding that she is using gabapentin less. On Monday, she worked a lot of hours and has to use gabapentin after these days.  Overall though is able to go to the gym, is able to workout fairly regularly.  Nothing that stops her from activity but can have soreness more at night.  Medications patient has been prescribed: Gabapentin, Zanaflex  Taking: Intermittently         Reviewed prior external information including notes and imaging from previsou exam, outside providers and external EMR if available.   As well as notes that were available from care everywhere and other healthcare systems.  Past medical history, social, surgical and family history all reviewed in electronic medical record.  [name] pertanent information unless stated regarding to the chief complaint.   Past Medical History:  Diagnosis Date  . Chicken pox   . Thyroid disease     Allergies  Allergen Reactions  . Amoxicillin Nausea And Vomiting     Review of Systems:  [name] headache, visual changes, nausea, vomiting, diarrhea, constipation, dizziness, abdominal pain, skin rash, fevers, chills, night sweats, weight loss, swollen lymph nodes, body aches, joint swelling,  chest pain, shortness of breath, mood changes. POSITIVE muscle aches  Objective  Blood pressure 110/72, pulse 82, height 5\' 6"  (1.676 m), weight 152 lb (68.9 kg), SpO2 99 %.   General: [name] apparent distress alert and oriented x3 mood and affect normal, dressed appropriately.  HEENT: Pupils equal, extraocular movements intact  Respiratory: Patient's speak in full sentences and does not appear short of breath  Cardiovascular: [name] lower extremity edema, non tender, [name] erythema   Gait normal with good balance and coordination.  MSK:  Non tender with full range of motion and good stability and symmetric strength and tone of shoulders, elbows, wrist, hip, knee and ankles bilaterally.  Back -patient's low back does have some mild loss of lordosis.  Tightness noted more in the left sacroiliac joint.  Negative straight leg test.  Mild therapy.  Osteopathic findings   C6 flexed rotated and side bent left T3 extended rotated and side bent right inhaled rib T9 extended rotated and side bent left L2 flexed rotated and side bent right Sacrum left on left       Assessment and Plan: Lumbar paraspinal muscle spasm Patient is doing better overall.  Patient will have a refill of Zanaflex anxiety intermittently.  Patient is taking the gabapentin but very intermittently as well.  Patient has been working with a Clinical research associate and I do think patient has a little bit better core stabilization which I think is helpful.  Patient will follow up with me again in 6 to 8 weeks     Nonallopathic problems  Decision today to treat with OMT was  based on Physical Exam  After verbal consent patient was treated with HVLA, ME, FPR techniques in cervical, rib, thoracic, lumbar, and sacral  areas  Patient tolerated the procedure well with improvement in symptoms  Patient given exercises, stretches and lifestyle modifications  See medications in patient instructions if given  Patient will follow up in 4-8 weeks       The above documentation has been reviewed and is accurate and complete Lyndal Pulley, DO       Note: This dictation was prepared with Dragon dictation along with smaller phrase technology. Any transcriptional errors that result from this process are unintentional.

## 2021-01-04 ENCOUNTER — Other Ambulatory Visit: Payer: Self-pay

## 2021-01-04 ENCOUNTER — Ambulatory Visit (INDEPENDENT_AMBULATORY_CARE_PROVIDER_SITE_OTHER): Payer: No Typology Code available for payment source | Admitting: Family Medicine

## 2021-01-04 ENCOUNTER — Other Ambulatory Visit: Payer: Self-pay | Admitting: Family Medicine

## 2021-01-04 ENCOUNTER — Encounter: Payer: Self-pay | Admitting: Family Medicine

## 2021-01-04 VITALS — BP 110/72 | HR 82 | Ht 66.0 in | Wt 152.0 lb

## 2021-01-04 DIAGNOSIS — M999 Biomechanical lesion, unspecified: Secondary | ICD-10-CM | POA: Diagnosis not present

## 2021-01-04 DIAGNOSIS — M62830 Muscle spasm of back: Secondary | ICD-10-CM

## 2021-01-04 MED ORDER — TIZANIDINE HCL 4 MG PO TABS: 4.0000 mg | ORAL_TABLET | Freq: Every day | ORAL | 0 refills | Status: DC

## 2021-01-04 MED FILL — tiZANidine HCL 4 MG TABS: 4 | 30 days supply | Qty: 30 | Fill #0

## 2021-01-04 NOTE — Assessment & Plan Note (Signed)
Patient is doing better overall.  Patient will have a refill of Zanaflex anxiety intermittently.  Patient is taking the gabapentin but very intermittently as well.  Patient has been working with a Clinical research associate and I do think patient has a little bit better core stabilization which I think is helpful.  Patient will follow up with me again in 6 to 8 weeks

## 2021-01-07 ENCOUNTER — Other Ambulatory Visit (HOSPITAL_COMMUNITY): Payer: Self-pay | Admitting: Physician Assistant

## 2021-01-07 MED FILL — metroNIDAZOLE 1 % GEL: 1 | 30 days supply | Qty: 60 | Fill #0

## 2021-01-07 MED FILL — FLUTICASONE PROP 0.05% CRM: 0.05 | 14 days supply | Qty: 60 | Fill #0

## 2021-01-25 ENCOUNTER — Other Ambulatory Visit (HOSPITAL_BASED_OUTPATIENT_CLINIC_OR_DEPARTMENT_OTHER): Payer: Self-pay

## 2021-02-06 ENCOUNTER — Other Ambulatory Visit (HOSPITAL_COMMUNITY): Payer: Self-pay

## 2021-02-06 ENCOUNTER — Other Ambulatory Visit: Payer: Self-pay | Admitting: Internal Medicine

## 2021-02-06 DIAGNOSIS — E038 Other specified hypothyroidism: Secondary | ICD-10-CM

## 2021-02-06 MED ORDER — LEVOTHYROXINE SODIUM 75 MCG PO TABS
ORAL_TABLET | ORAL | 1 refills | Status: DC
  Filled 2021-02-06: qty 114, 90d supply, fill #0
  Filled 2021-05-25: qty 114, 90d supply, fill #1

## 2021-02-07 ENCOUNTER — Other Ambulatory Visit (HOSPITAL_COMMUNITY): Payer: Self-pay

## 2021-02-19 ENCOUNTER — Ambulatory Visit: Payer: No Typology Code available for payment source | Admitting: Family Medicine

## 2021-02-20 ENCOUNTER — Other Ambulatory Visit (HOSPITAL_COMMUNITY): Payer: Self-pay

## 2021-02-20 MED ORDER — DROSPIREN-ETH ESTRAD-LEVOMEFOL 3-0.02-0.451 MG PO TABS
ORAL_TABLET | ORAL | 4 refills | Status: DC
  Filled 2021-02-20: qty 84, 63d supply, fill #0
  Filled 2021-06-10: qty 84, 63d supply, fill #1
  Filled 2021-08-20: qty 84, 63d supply, fill #2
  Filled 2021-11-05: qty 28, 24d supply, fill #3

## 2021-02-21 ENCOUNTER — Encounter: Payer: Self-pay | Admitting: Internal Medicine

## 2021-02-21 NOTE — Progress Notes (Signed)
Outside notes received. Information abstracted. Notes sent to scan.  

## 2021-02-26 ENCOUNTER — Telehealth: Payer: Self-pay | Admitting: Internal Medicine

## 2021-02-26 ENCOUNTER — Other Ambulatory Visit (HOSPITAL_COMMUNITY): Payer: Self-pay

## 2021-02-26 DIAGNOSIS — Z111 Encounter for screening for respiratory tuberculosis: Secondary | ICD-10-CM

## 2021-02-26 NOTE — Telephone Encounter (Signed)
Patient is requesting a TB gold to be ordered. She needs it for school.  Please advise. Thank you.

## 2021-02-26 NOTE — Telephone Encounter (Signed)
Spoke with patient today. 

## 2021-02-26 NOTE — Telephone Encounter (Signed)
Ordered for [place] 

## 2021-02-27 ENCOUNTER — Other Ambulatory Visit (HOSPITAL_COMMUNITY): Payer: Self-pay

## 2021-02-27 ENCOUNTER — Telehealth: Payer: Self-pay | Admitting: Family Medicine

## 2021-02-27 ENCOUNTER — Other Ambulatory Visit: Payer: Self-pay

## 2021-02-27 MED ORDER — MELOXICAM 15 MG PO TABS
1.0000 | ORAL_TABLET | Freq: Every day | ORAL | 0 refills | Status: AC
  Filled 2021-02-27: qty 30, 30d supply, fill #0

## 2021-02-27 NOTE — Telephone Encounter (Signed)
Patient informed. 

## 2021-02-27 NOTE — Telephone Encounter (Signed)
Patient called requesting a refill on her meloxicam (MOBIC) 15 MG tablet to be sent to [facility].  Please advise.

## 2021-02-27 NOTE — Telephone Encounter (Signed)
Rx sent in

## 2021-03-01 ENCOUNTER — Other Ambulatory Visit: Payer: No Typology Code available for payment source

## 2021-03-01 ENCOUNTER — Other Ambulatory Visit: Payer: Self-pay

## 2021-03-01 ENCOUNTER — Encounter: Payer: Self-pay | Admitting: Internal Medicine

## 2021-03-01 DIAGNOSIS — Z111 Encounter for screening for respiratory tuberculosis: Secondary | ICD-10-CM

## 2021-03-03 LAB — QUANTIFERON-TB GOLD PLUS
Mitogen-NIL: >10 IU/mL
NIL: 0.04 IU/mL
QuantiFERON-TB Gold Plus: NEGATIVE
TB1-NIL: 0.03 IU/mL
TB2-NIL: 0.02 IU/mL

## 2021-03-21 ENCOUNTER — Telehealth: Payer: No Typology Code available for payment source | Admitting: Emergency Medicine

## 2021-03-21 ENCOUNTER — Other Ambulatory Visit: Payer: Self-pay | Admitting: Internal Medicine

## 2021-03-21 DIAGNOSIS — J329 Chronic sinusitis, unspecified: Secondary | ICD-10-CM

## 2021-03-21 MED ORDER — DOXYCYCLINE HYCLATE 100 MG PO CAPS: 100.0000 mg | ORAL_CAPSULE | Freq: Two times a day (BID) | ORAL | 0 refills | Status: DC

## 2021-03-21 NOTE — Progress Notes (Signed)
We are sorry that you are not feeling well.  Here is how we plan to help!  Based on what you have shared with me it looks like you have sinusitis.  Sinusitis is inflammation and infection in the sinus cavities of the head.  Based on your presentation I believe you most likely have Acute Bacterial Sinusitis.  This is an infection caused by bacteria and is treated with antibiotics. I have prescribed Doxycycline 100mg by mouth twice a day for 10 days. You may use an oral decongestant such as Mucinex D or if you have glaucoma or high blood pressure use plain Mucinex. Saline nasal spray help and can safely be used as often as needed for congestion.  If you develop worsening sinus pain, fever or notice severe headache and vision changes, or if symptoms are not better after completion of antibiotic, please schedule an appointment with a health care provider.    Sinus infections are not as easily transmitted as other respiratory infection, however we still recommend that you avoid close contact with loved ones, especially the very young and elderly.  Remember to wash your hands thoroughly throughout the day as this is the number one way to prevent the spread of infection!  Home Care:  Only take medications as instructed by your medical team.  Complete the entire course of an antibiotic.  Do not take these medications with alcohol.  A steam or ultrasonic humidifier can help congestion.  You can place a towel over your head and breathe in the steam from hot water coming from a faucet.  Avoid close contacts especially the very young and the elderly.  Cover your mouth when you cough or sneeze.  Always remember to wash your hands.  Get Help Right Away If:  You develop worsening fever or sinus pain.  You develop a severe head ache or visual changes.  Your symptoms persist after you have completed your treatment plan.  Make sure you  Understand these instructions.  Will watch your  condition.  Will get help right away if you are not doing well or get worse.  Your e-visit answers were reviewed by a board certified advanced clinical practitioner to complete your personal care plan.  Depending on the condition, your plan could have included both over the counter or prescription medications.  If there is a problem please reply  once you have received a response from your provider.  Your safety is important to us.  If you have drug allergies check your prescription carefully.    You can use MyChart to ask questions about today's visit, request a non-urgent call back, or ask for a work or school excuse for 24 hours related to this e-Visit. If it has been greater than 24 hours you will need to follow up with your provider, or enter a new e-Visit to address those concerns.  You will get an e-mail in the next two days asking about your experience.  I hope that your e-visit has been valuable and will speed your recovery. Thank you for using e-visits.   Approximately 5 minutes was used in reviewing the patient's chart, questionnaire, prescribing medications, and documentation.  

## 2021-05-25 ENCOUNTER — Other Ambulatory Visit (HOSPITAL_COMMUNITY): Payer: Self-pay

## 2021-05-29 ENCOUNTER — Other Ambulatory Visit (HOSPITAL_COMMUNITY): Payer: Self-pay

## 2021-06-10 ENCOUNTER — Other Ambulatory Visit (HOSPITAL_COMMUNITY): Payer: Self-pay

## 2021-06-11 ENCOUNTER — Other Ambulatory Visit (HOSPITAL_COMMUNITY): Payer: Self-pay

## 2021-06-11 NOTE — Patient Instructions (Addendum)
Blood work was ordered.     Medications changes include :   None     Please followup in 1 year   Health Maintenance, Female Adopting a healthy lifestyle and getting preventive care are important in promoting health and wellness. Ask your health care provider about: The right schedule for you to have regular tests and exams. Things you can do on your own to prevent diseases and keep yourself healthy. What should I know about diet, weight, and exercise? Eat a healthy diet  Eat a diet that includes plenty of vegetables, fruits, low-fat dairy products, and lean protein. Do not eat a lot of foods that are high in solid fats, added sugars, or sodium.  Maintain a healthy weight Body mass index (BMI) is used to identify weight problems. It estimates body fat based on height and weight. Your health care provider can help determineyour BMI and help you achieve or maintain a healthy weight. Get regular exercise Get regular exercise. This is one of the most important things you can do for your health. Most adults should: Exercise for at least 150 minutes each week. The exercise should increase your heart rate and make you sweat (moderate-intensity exercise). Do strengthening exercises at least twice a week. This is in addition to the moderate-intensity exercise. Spend less time sitting. Even light physical activity can be beneficial. Watch cholesterol and blood lipids Have your blood tested for lipids and cholesterol at 30 years of age, then havethis test every 5 years. Have your cholesterol levels checked more often if: Your lipid or cholesterol levels are high. You are older than 30 years of age. You are at high risk for heart disease. What should I know about cancer screening? Depending on your health history and family history, you may need to have cancer screening at various ages. This may include screening for: Breast cancer. Cervical cancer. Colorectal cancer. Skin cancer. Lung  cancer. What should I know about heart disease, diabetes, and high blood pressure? Blood pressure and heart disease High blood pressure causes heart disease and increases the risk of stroke. This is more likely to develop in people who have high blood pressure readings, are of African descent, or are overweight. Have your blood pressure checked: Every 3-5 years if you are 30-31 years of age. Every year if you are 30 years old or older. Diabetes Have regular diabetes screenings. This checks your fasting blood sugar level. Have the screening done: Once every three years after age 30 if you are at a normal weight and have a low risk for diabetes. More often and at a younger age if you are overweight or have a high risk for diabetes. What should I know about preventing infection? Hepatitis B If you have a higher risk for hepatitis B, you should be screened for this virus. Talk with your health care provider to find out if you are at risk forhepatitis B infection. Hepatitis C Testing is recommended for: Everyone born from [birth year] through 1965. Anyone with known risk factors for hepatitis C. Sexually transmitted infections (STIs) Get screened for STIs, including gonorrhea and chlamydia, if: You are sexually active and are younger than 30 years of age. You are older than 30 years of age and your health care provider tells you that you are at risk for this type of infection. Your sexual activity has changed since you were last screened, and you are at increased risk for chlamydia or gonorrhea. Ask your health care provider if you are at  risk. Ask your health care provider about whether you are at high risk for HIV. Your health care provider may recommend a prescription medicine to help prevent HIV infection. If you choose to take medicine to prevent HIV, you should first get tested for HIV. You should then be tested every 3 months for as long as you are taking the medicine. Pregnancy If you are about  to stop having your period (premenopausal) and you may become pregnant, seek counseling before you get pregnant. Take 400 to 800 micrograms (mcg) of folic acid every day if you become pregnant. Ask for birth control (contraception) if you want to prevent pregnancy. Osteoporosis and menopause Osteoporosis is a disease in which the bones lose minerals and strength with aging. This can result in bone fractures. If you are 30 years old or older, or if you are at risk for osteoporosis and fractures, ask your health care provider if you should: Be screened for bone loss. Take a calcium or vitamin D supplement to lower your risk of fractures. Be given hormone replacement therapy (HRT) to treat symptoms of menopause. Follow these instructions at home: Lifestyle Do not use any products that contain nicotine or tobacco, such as cigarettes, e-cigarettes, and chewing tobacco. If you need help quitting, ask your health care provider. Do not use street drugs. Do not share needles. Ask your health care provider for help if you need support or information about quitting drugs. Alcohol use Do not drink alcohol if: Your health care provider tells you not to drink. You are pregnant, may be pregnant, or are planning to become pregnant. If you drink alcohol: Limit how much you use to 0-1 drink a day. Limit intake if you are breastfeeding. Be aware of how much alcohol is in your drink. In the [place] one drink equals one 12 oz bottle of beer (355 mL), one 5 oz glass of wine (148 mL), or one 1 oz glass of hard liquor (44 mL). General instructions Schedule regular health, dental, and eye exams. Stay current with your vaccines. Tell your health care provider if: You often feel depressed. You have ever been abused or do not feel safe at home. Summary Adopting a healthy lifestyle and getting preventive care are important in promoting health and wellness. Follow your health care provider's instructions about  healthy diet, exercising, and getting tested or screened for diseases. Follow your health care provider's instructions on monitoring your cholesterol and blood pressure. This information is not intended to replace advice given to you by your health care provider. Make sure you discuss any questions you have with your healthcare provider. Document Revised: 10/13/2018 Document Reviewed: 10/13/2018 Elsevier Patient Education  2022 [employer].

## 2021-06-11 NOTE — Progress Notes (Signed)
Subjective:    Patient ID: Patricia [name], female    DOB: 08/08/1991, 30 y.o.   MRN: [id number]   This visit occurred during the SARS-CoV-2 public health emergency.  Safety protocols were in place, including screening questions prior to the visit, additional usage of staff PPE, and extensive cleaning of exam room while observing appropriate contact time as indicated for disinfecting solutions.    HPI She is here for a physical exam.   She has no concerns.  Medications and allergies reviewed with patient and updated if appropriate.  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 1 each 1   BEYAZ 3-0.02-0.451 MG tablet      Cholecalciferol (VITAMIN D) 50 MCG (2000 UT) CAPS Take 1 capsule (2,000 Units total) by mouth daily. 30 capsule    clindamycin (CLEOCIN T) 1 % external solution Apply topically 2 (two) times daily. 30 mL 5   doxycycline (VIBRAMYCIN) 100 MG capsule Take 1 capsule (100 mg total) by mouth 2 (two) times daily. 20 capsule 0   Drospirenone-Ethinyl Estradiol-Levomefol (BEYAZ) 3-0.02-0.451 MG tablet TAKE 1 TABLET BY MOUTH  DAILY FOR LONG-CONTINUOUS CYCLES 84 tablet 0   Drospirenone-Ethinyl Estradiol-Levomefol (BEYAZ) 3-0.02-0.451 MG tablet TAKE 1 TABLET BY MOUTH DAILY FOR LONG-CONTINUOUS CYCLES 84 tablet 4   Drospirenone-Ethinyl Estradiol-Levomefol (BEYAZ) 3-0.02-0.451 MG tablet TAKE 1 TABLET BY MOUTH DAILY FOR LONG-CONTINUOUS CYCLES 84 tablet 4   fluticasone (CUTIVATE) 0.05 % cream APPLY TO THE AFFECTED AREA(S) 2 TIMES DAILY AS NEEDED FOR FLARES FOR UP TO 2 WEEKS 60 g 2   gabapentin (NEURONTIN) 100 MG capsule TAKE 2 CAPSULES BY MOUTH AT BEDTIME 180 capsule 3   levothyroxine (SYNTHROID) 75 MCG tablet TAKE 1 TABLET BY MOUTH ONCE DAILY BEFORE BREAKFAST EXCEPT FOR TWICE A WEEK TAKE 2 TABLETS 114 tablet 1   meloxicam (MOBIC) 15 MG tablet Take 1 tablet (15 mg total) by mouth daily. 30 tablet 0   metroNIDAZOLE (METROGEL) 1 % gel APPLY DAILY TO THE FACE AS DIRECTED 60 g 4   tiZANidine (ZANAFLEX) 4 MG tablet Take 1 tablet (4 mg total) by mouth at bedtime. 30 tablet 0   tiZANidine (ZANAFLEX) 4 MG tablet Take 1 tablet (4 mg total) by mouth at bedtime. 30 tablet 1   tiZANidine (ZANAFLEX) 4 MG tablet TAKE 1 TABLET BY MOUTH AT BEDTIME 30 tablet 0   No current facility-administered medications on file prior to visit.    Past Medical History:  Diagnosis Date   Chicken pox    Thyroid disease     Past Surgical History:  Procedure Laterality Date   MOHS SURGERY  2017   nose   WISDOM TOOTH EXTRACTION      Social History   Socioeconomic History   Marital status: Single    Spouse name: Not on file   Number of children: 0   Years of education: 14   Highest education level: Not on file  Occupational History   Occupation: CMA    Employer: [employer]  Tobacco Use   Smoking status: Never   Smokeless tobacco: Never  Substance and Sexual Activity   Alcohol use: Yes    Alcohol/week: 3.0 standard drinks    Types: 1 Glasses of wine, 1 Cans of beer, 1 Shots of liquor per week   Drug use: No   Sexual  activity: Yes    Birth control/protection: Pill  Other Topics Concern   Not on file  Social History Narrative   Patricia [name] lives with her father & grandmother.    Denies abuse and feels safe at home.    Social Determinants of Health   Financial Resource Strain: Not on file  Food Insecurity: Not on file  Transportation Needs: Not on file  Physical Activity: Not on file  Stress: Not on file  Social Connections: Not on file    Family History  Problem Relation Age of Onset   Arthritis Father    Hypertension Father    Thyroid disease Father    Diabetes Father    Depression Mother    Thyroid disease Sister    Healthy Maternal Grandmother    Diabetes Maternal Grandfather    Hypertension Maternal Grandfather    COPD Maternal Grandfather    Hypertension Paternal Grandmother    Diabetes Paternal Grandfather     Review of Systems     Objective:  There were no vitals filed for this visit. There were no vitals filed for this visit. There is no height or weight on file to calculate BMI.  BP Readings from Last 3 Encounters:  01/04/21 110/72  11/22/20 130/80  10/24/20 102/74    Wt Readings from Last 3 Encounters:  01/04/21 152 lb (68.9 kg)  11/22/20 152 lb (68.9 kg)  07/05/20 150 lb (68 kg)    Depression screen [facility] 2/9 06/12/2021 03/08/2020 02/04/2018 12/13/2015  Decreased Interest 0 0 0 0  Down, Depressed, Hopeless 0 0 0 0  PHQ - 2 Score 0 0 0 0  Altered sleeping 0 - - -  Tired, decreased energy 0 - - -  Change in appetite 0 - - -  Feeling bad or failure about yourself  0 - - -  Trouble concentrating 1 - - -  Moving slowly or fidgety/restless 0 - - -  Suicidal thoughts 0 - - -  PHQ-9 Score 1 - - -  Difficult doing work/chores Not difficult at all - - -    GAD 7 : Generalized Anxiety Score 06/12/2021  Nervous, Anxious, on Edge 0  Control/stop worrying 0  Worry too much - different things 0  Trouble relaxing 0  Restless 0  Easily annoyed or irritable 0  Afraid -  awful might happen 0  Total GAD 7 Score 0  Anxiety Difficulty Not difficult at all       Physical Exam Constitutional: She appears well-developed and well-nourished. No distress.  HENT:  Head: Normocephalic and atraumatic.  Right Ear: External ear normal. Normal ear canal and TM Left Ear: External ear normal.  Normal ear canal and TM Mouth/Throat: Oropharynx is clear and moist.  Eyes: Conjunctivae and EOM  are normal.  Neck: Neck supple. No tracheal deviation present. No thyromegaly present.  No carotid bruit  Cardiovascular: Normal rate, regular rhythm and normal heart sounds.   No murmur heard.  No edema. Pulmonary/Chest: Effort normal and breath sounds normal. No respiratory distress. She has no wheezes. She has no rales.  Breast: deferred   Abdominal: Soft. She exhibits no distension. There is no tenderness.  Lymphadenopathy: She has no cervical adenopathy.  Skin: Skin is warm and dry. She is not diaphoretic.  Psychiatric: She has a normal mood and affect. Her behavior is normal.     Lab Results  Component Value Date   WBC 6.7 03/08/2020   HGB 13.1 03/08/2020   HCT 38.7 03/08/2020   PLT 269.0 03/08/2020   GLUCOSE 94 03/08/2020   CHOL 183 03/08/2020   TRIG 83.0 03/08/2020   HDL 75.70 03/08/2020   LDLCALC 91 03/08/2020   ALT 11 03/08/2020   AST 17 03/08/2020   NA 139 03/08/2020   K 4.1 03/08/2020   CL 106 03/08/2020   CREATININE 0.73 03/08/2020   BUN 11 03/08/2020   CO2 24 03/08/2020   TSH 2.48 04/13/2020         Assessment & Plan:   Physical exam: Screening blood work  ordered Exercise  not regular Weight  she will work on weight loss Substance abuse  none Sees dermatology regularly   Screened for depression using the PHQ 9 scale.  No evidence of depression.    Screened for anxiety using GAD7 Scale.  No evidence of anxiety.  Reviewed recommended immunizations.   Health Maintenance  Topic Date Due   Hepatitis C Screening  Never done    INFLUENZA VACCINE  06/03/2021   PAP SMEAR-Modifier  02/21/2023   TETANUS/TDAP  04/03/2025   HIV Screening  Completed   Pneumococcal Vaccine 51-1 Years old  Aged Out   HPV VACCINES  Aged Out          See Problem List for Assessment and Plan of chronic medical problems.

## 2021-06-12 ENCOUNTER — Ambulatory Visit (INDEPENDENT_AMBULATORY_CARE_PROVIDER_SITE_OTHER): Payer: No Typology Code available for payment source | Admitting: Internal Medicine

## 2021-06-12 ENCOUNTER — Other Ambulatory Visit (HOSPITAL_COMMUNITY): Payer: Self-pay

## 2021-06-12 ENCOUNTER — Encounter: Payer: Self-pay | Admitting: Internal Medicine

## 2021-06-12 ENCOUNTER — Other Ambulatory Visit: Payer: Self-pay

## 2021-06-12 VITALS — BP 110/78 | HR 82 | Temp 98.6°F | Ht 66.0 in | Wt 160.0 lb

## 2021-06-12 DIAGNOSIS — Z0000 Encounter for general adult medical examination without abnormal findings: Secondary | ICD-10-CM

## 2021-06-12 DIAGNOSIS — Z1331 Encounter for screening for depression: Secondary | ICD-10-CM

## 2021-06-12 DIAGNOSIS — E039 Hypothyroidism, unspecified: Secondary | ICD-10-CM | POA: Diagnosis not present

## 2021-06-12 LAB — LIPID PANEL
Cholesterol: 212 mg/dL — ABNORMAL HIGH (ref 0–200)
HDL: 75.4 mg/dL (ref >39.00)
LDL Cholesterol: 110 mg/dL — ABNORMAL HIGH (ref 0–99)
NonHDL: 137.06
Total CHOL/HDL Ratio: 3
Triglycerides: 134 mg/dL (ref 0.0–149.0)
VLDL: 26.8 mg/dL (ref 0.0–40.0)

## 2021-06-12 LAB — COMPREHENSIVE METABOLIC PANEL
ALT: 15 U/L (ref 0–35)
AST: 23 U/L (ref 0–37)
Albumin: 4.1 g/dL (ref 3.5–5.2)
Alkaline Phosphatase: 47 U/L (ref 39–117)
BUN: 15 mg/dL (ref 6–23)
CO2: 27 mEq/L (ref 19–32)
Calcium: 9.5 mg/dL (ref 8.4–10.5)
Chloride: 103 mEq/L (ref 96–112)
Creatinine, Ser: 0.74 mg/dL (ref 0.40–1.20)
GFR: 108.58 mL/min (ref >60.00)
Glucose, Bld: 77 mg/dL (ref 70–99)
Potassium: 3.5 mEq/L (ref 3.5–5.1)
Sodium: 140 mEq/L (ref 135–145)
Total Bilirubin: 0.5 mg/dL (ref 0.2–1.2)
Total Protein: 7.3 g/dL (ref 6.0–8.3)

## 2021-06-12 LAB — CBC WITH DIFFERENTIAL/PLATELET
Basophils Absolute: 0 10*3/uL (ref 0.0–0.1)
Basophils Relative: 0.6 % (ref 0.0–3.0)
Eosinophils Absolute: 0.3 10*3/uL (ref 0.0–0.7)
Eosinophils Relative: 3.6 % (ref 0.0–5.0)
HCT: 37.8 % (ref 36.0–46.0)
Hemoglobin: 12.7 g/dL (ref 12.0–15.0)
Lymphocytes Relative: 28.2 % (ref 12.0–46.0)
Lymphs Abs: 2 10*3/uL (ref 0.7–4.0)
MCHC: 33.7 g/dL (ref 30.0–36.0)
MCV: 87.4 fl (ref 78.0–100.0)
Monocytes Absolute: 0.5 10*3/uL (ref 0.1–1.0)
Monocytes Relative: 7.6 % (ref 3.0–12.0)
Neutro Abs: 4.2 10*3/uL (ref 1.4–7.7)
Neutrophils Relative %: 60 % (ref 43.0–77.0)
Platelets: 274 10*3/uL (ref 150.0–400.0)
RBC: 4.32 Mil/uL (ref 3.87–5.11)
RDW: 12.6 % (ref 11.5–15.5)
WBC: 7 10*3/uL (ref 4.0–10.5)

## 2021-06-12 LAB — TSH: TSH: 1.14 u[IU]/mL (ref 0.35–5.50)

## 2021-06-12 NOTE — Assessment & Plan Note (Signed)
Chronic  Clinically euthyroid Currently taking levothyroxine 75 mcg daily 5 days a week and 150 mcg daily twice a week Check tsh  Titrate med dose if needed

## 2021-08-01 ENCOUNTER — Ambulatory Visit: Payer: Self-pay

## 2021-08-01 ENCOUNTER — Ambulatory Visit (INDEPENDENT_AMBULATORY_CARE_PROVIDER_SITE_OTHER): Payer: No Typology Code available for payment source | Admitting: Family Medicine

## 2021-08-01 ENCOUNTER — Other Ambulatory Visit: Payer: Self-pay

## 2021-08-01 ENCOUNTER — Encounter: Payer: Self-pay | Admitting: Family Medicine

## 2021-08-01 VITALS — Ht 66.0 in | Wt 158.0 lb

## 2021-08-01 DIAGNOSIS — M84376A Stress fracture, unspecified foot, initial encounter for fracture: Secondary | ICD-10-CM | POA: Insufficient documentation

## 2021-08-01 DIAGNOSIS — M25571 Pain in right ankle and joints of right foot: Secondary | ICD-10-CM

## 2021-08-01 DIAGNOSIS — M84374A Stress fracture, right foot, initial encounter for fracture: Secondary | ICD-10-CM

## 2021-08-01 NOTE — Progress Notes (Signed)
img Patricia [name] - 30 y.o. female MRN [id number]  Date of birth: 04-Jul-1991  SUBJECTIVE:  Including CC & ROS.  No chief complaint on file.   Patricia [name] is a 30 y.o. female that is presenting with right heel pain.  She was playing softball and felt a sharp pain in the heel after she was running the bases.  Continues to have pain with ambulation and weightbearing.    Review of Systems See HPI   HISTORY: Past Medical, Surgical, Social, and Family History Reviewed & Updated per EMR.   Pertinent Historical Findings include:  Past Medical History:  Diagnosis Date   Chicken pox    Thyroid disease     Past Surgical History:  Procedure Laterality Date   MOHS SURGERY  2017   nose   WISDOM TOOTH EXTRACTION      Family History  Problem Relation Age of Onset   Arthritis Father    Hypertension Father    Thyroid disease Father    Diabetes Father    Depression Mother    Thyroid disease Sister    Healthy Maternal Grandmother    Diabetes Maternal Grandfather    Hypertension Maternal Grandfather    COPD Maternal Grandfather    Hypertension Paternal Grandmother    Diabetes Paternal Grandfather     Social History   Socioeconomic History   Marital status: Single    Spouse name: Not on file   Number of children: 0   Years of education: 14   Highest education level: Not on file  Occupational History   Occupation: CMA    Employer: [employer]  Tobacco Use   Smoking status: Never   Smokeless tobacco: Never  Substance and Sexual Activity   Alcohol use: Yes    Alcohol/week: 3.0 standard drinks    Types: 1 Glasses of wine, 1 Cans of beer, 1 Shots of liquor per week   Drug use: No   Sexual activity: Yes    Birth control/protection: Pill  Other Topics Concern   Not on file  Social History Narrative   [name] lives with her father & grandmother.    Denies abuse and feels safe at home.    Social Determinants of Health   Financial Resource Strain:  Not on file  Food Insecurity: Not on file  Transportation Needs: Not on file  Physical Activity: Not on file  Stress: Not on file  Social Connections: Not on file  Intimate Partner Violence: Not on file     PHYSICAL EXAM:  VS: Ht 5\' 6"  (1.676 m)   Wt 158 lb (71.7 kg)   BMI 25.50 kg/m  Physical Exam Gen: NAD, alert, cooperative with exam, well-appearing   Limited ultrasound: Right foot and ankle:  No ankle effusion. Normal-appearing peroneal tendons. Normal insertion of the peroneal brevis to the base of the fifth metatarsal. There is hyperemia over the lateral aspect of the calcaneus to suggest a stress change or fracture.  Summary: Stress change of the calcaneus  Ultrasound and interpretation by Clearance Coots, MD    ASSESSMENT & PLAN:   Stress fracture of calcaneus Has stress changes observed after running in her softball game.  Stress reaction versus fracture. -Counseled on home exercise therapy and supportive care. -Cam walker today. -Could consider further imaging.

## 2021-08-01 NOTE — Patient Instructions (Signed)
Good to see you  Please send me a message in MyChart with any questions or updates.  Please see me back in 2 weeks.   --Dr. Raeford Razor

## 2021-08-01 NOTE — Assessment & Plan Note (Signed)
Has stress changes observed after running in her softball game.  Stress reaction versus fracture. -Counseled on home exercise therapy and supportive care. -Cam walker today. -Could consider further imaging.

## 2021-08-20 ENCOUNTER — Other Ambulatory Visit (HOSPITAL_COMMUNITY): Payer: Self-pay

## 2021-08-21 ENCOUNTER — Other Ambulatory Visit: Payer: Self-pay | Admitting: Internal Medicine

## 2021-08-21 ENCOUNTER — Other Ambulatory Visit (HOSPITAL_COMMUNITY): Payer: Self-pay

## 2021-08-21 DIAGNOSIS — M79673 Pain in unspecified foot: Secondary | ICD-10-CM

## 2021-08-21 NOTE — Addendum Note (Signed)
Addended by: Binnie Rail on: 08/21/2021 01:29 PM   Modules accepted: Orders

## 2021-09-05 ENCOUNTER — Other Ambulatory Visit (HOSPITAL_COMMUNITY): Payer: Self-pay

## 2021-09-05 MED FILL — Metronidazole Gel 1%: CUTANEOUS | 30 days supply | Qty: 60 | Fill #0 | Status: AC

## 2021-09-13 ENCOUNTER — Other Ambulatory Visit: Payer: Self-pay | Admitting: Internal Medicine

## 2021-09-13 ENCOUNTER — Other Ambulatory Visit (HOSPITAL_COMMUNITY): Payer: Self-pay

## 2021-09-13 ENCOUNTER — Telehealth (HOSPITAL_COMMUNITY): Payer: Self-pay | Admitting: Family Medicine

## 2021-09-13 ENCOUNTER — Ambulatory Visit (HOSPITAL_COMMUNITY)
Admission: EM | Admit: 2021-09-13 | Discharge: 2021-09-13 | Disposition: A | Discharge status: Home / Self Care | Payer: No Typology Code available for payment source | Attending: Family Medicine | Admitting: Family Medicine

## 2021-09-13 ENCOUNTER — Other Ambulatory Visit: Payer: Self-pay

## 2021-09-13 DIAGNOSIS — E038 Other specified hypothyroidism: Secondary | ICD-10-CM

## 2021-09-13 DIAGNOSIS — M722 Plantar fascial fibromatosis: Secondary | ICD-10-CM

## 2021-09-13 DIAGNOSIS — E063 Autoimmune thyroiditis: Secondary | ICD-10-CM

## 2021-09-13 MED ORDER — KETOROLAC TROMETHAMINE 60 MG/2ML IM SOLN
60.0000 mg | Freq: Once | INTRAMUSCULAR | Status: AC
  Administered 2021-09-13: 60 mg via INTRAMUSCULAR

## 2021-09-13 MED ORDER — LEVOTHYROXINE SODIUM 75 MCG PO TABS
ORAL_TABLET | ORAL | 1 refills | Status: DC
  Filled 2021-09-13: qty 114, 89d supply, fill #0
  Filled 2021-12-19: qty 114, 89d supply, fill #1

## 2021-09-13 MED FILL — Gabapentin Cap 100 MG: ORAL | 90 days supply | Qty: 180 | Fill #0 | Status: AC

## 2021-09-13 NOTE — Telephone Encounter (Signed)
Erroneous

## 2021-11-05 ENCOUNTER — Other Ambulatory Visit (HOSPITAL_COMMUNITY): Payer: Self-pay

## 2021-11-05 NOTE — ED Provider Notes (Signed)
Nurse visit for Toradol IM injection for bilateral plantar fasciitis. Patient unable to obtain an appointment with her specialist today. Confirmed not currently taking any other NSAIDS. Patient is scheduled to see provider in one week.    Scot Jun, [place] 11/05/21 [phone]

## 2021-11-13 ENCOUNTER — Other Ambulatory Visit (HOSPITAL_COMMUNITY): Payer: Self-pay

## 2021-12-19 ENCOUNTER — Other Ambulatory Visit (HOSPITAL_COMMUNITY): Payer: Self-pay

## 2022-03-19 ENCOUNTER — Other Ambulatory Visit (HOSPITAL_COMMUNITY): Payer: Self-pay

## 2022-03-19 MED ORDER — LO LOESTRIN FE 1 MG-10 MCG / 10 MCG PO TABS
1.0000 | ORAL_TABLET | Freq: Every day | ORAL | 3 refills | Status: DC
  Filled 2022-03-19 – 2022-07-03 (×2): qty 84, 84d supply, fill #0
  Filled 2022-09-20: qty 84, 84d supply, fill #1
  Filled 2022-12-11: qty 84, 84d supply, fill #2

## 2022-04-02 ENCOUNTER — Other Ambulatory Visit: Payer: Self-pay | Admitting: Internal Medicine

## 2022-04-02 DIAGNOSIS — E038 Other specified hypothyroidism: Secondary | ICD-10-CM

## 2022-04-03 ENCOUNTER — Other Ambulatory Visit (HOSPITAL_COMMUNITY): Payer: Self-pay

## 2022-04-03 MED ORDER — LEVOTHYROXINE SODIUM 75 MCG PO TABS
ORAL_TABLET | ORAL | 1 refills | Status: DC
  Filled 2022-04-03: qty 114, 90d supply, fill #0
  Filled 2022-07-03: qty 114, 88d supply, fill #1

## 2022-04-15 ENCOUNTER — Other Ambulatory Visit (HOSPITAL_COMMUNITY): Payer: Self-pay

## 2022-04-15 MED ORDER — METRONIDAZOLE 0.75 % EX GEL
CUTANEOUS | 0 refills | Status: DC
  Filled 2022-04-15: qty 45, 30d supply, fill #0

## 2022-04-17 ENCOUNTER — Other Ambulatory Visit (HOSPITAL_COMMUNITY): Payer: Self-pay

## 2022-04-17 MED ORDER — DROSPIREN-ETH ESTRAD-LEVOMEFOL 3-0.02-0.451 MG PO TABS
ORAL_TABLET | ORAL | 5 refills | Status: AC
  Filled 2022-04-17: qty 84, 63d supply, fill #0

## 2022-04-18 ENCOUNTER — Other Ambulatory Visit (HOSPITAL_COMMUNITY): Payer: Self-pay

## 2022-04-21 ENCOUNTER — Other Ambulatory Visit (HOSPITAL_COMMUNITY): Payer: Self-pay

## 2022-07-03 ENCOUNTER — Encounter: Payer: Self-pay | Admitting: Internal Medicine

## 2022-07-03 ENCOUNTER — Other Ambulatory Visit (HOSPITAL_COMMUNITY): Payer: Self-pay

## 2022-07-03 NOTE — Progress Notes (Signed)
Subjective:    Patient ID: Patricia [name], female    DOB: [birth year], 31 y.o.   MRN: [id number]      HPI Patricia [name] is here for a Physical exam.      Medications and allergies reviewed with patient and updated if appropriate.  Current Outpatient Medications on File Prior to Visit  Medication Sig Dispense Refill   Adapalene 0.3 % gel Apply topically to face at nigh 45 g 5   Cholecalciferol (VITAMIN D) 50 MCG (2000 UT) CAPS Take 1 capsule (2,000 Units total) by mouth daily. 30 capsule    clindamycin (CLEOCIN T) 1 % external solution Apply topically 2 (two) times daily. 30 mL 5   Drospirenone-Ethinyl Estradiol-Levomefol (BEYAZ) 3-0.02-0.451 MG tablet TAKE 1 TABLET BY MOUTH DAILY FOR LONG-CONTINUOUS CYCLES 84 tablet 5   gabapentin (NEURONTIN) 100 MG capsule TAKE 2 CAPSULES BY MOUTH AT BEDTIME 180 capsule 3   levothyroxine (SYNTHROID) 75 MCG tablet TAKE 1 TABLET BY MOUTH ONCE DAILY BEFORE BREAKFAST EXCEPT FOR TWICE A WEEK TAKE 2 TABLETS 114 tablet 1   LO LOESTRIN FE 1 MG-10 MCG / 10 MCG tablet Take 1 tablet by mouth daily. 84 tablet 3   meloxicam (MOBIC) 15 MG tablet Take 1 tablet (15 mg total) by mouth daily. 30 tablet 0   metroNIDAZOLE (METROGEL) 0.75 % gel Apply to face 2 times a day 45 g 0   No current facility-administered medications on file prior to visit.    Review of Systems  Constitutional:  Positive for fatigue. Negative for fever.  Eyes:  Negative for visual disturbance.  Respiratory:  Positive for cough (after crossfit). Negative for shortness of breath and wheezing.   Cardiovascular:  Negative for chest pain, palpitations and leg swelling.  Gastrointestinal:  Negative for abdominal pain, blood in stool, constipation, diarrhea and nausea.  Genitourinary:  Negative for dysuria.  Musculoskeletal:  Positive for arthralgias and back pain (chiropractor).  Skin:  Negative for rash.  Neurological:  Positive for headaches (occ). Negative for light-headedness.   Psychiatric/Behavioral:  Negative for dysphoric mood. The patient is not nervous/anxious.        Objective:   Vitals:   07/04/22 1303  BP: 108/78  Pulse: 77  Temp: 98 F (36.7 C)  SpO2: 98%   Filed Weights   07/04/22 1303  Weight: 165 lb (74.8 kg)   Body mass index is 26.63 kg/m.  BP Readings from Last 3 Encounters:  07/04/22 108/78  06/12/21 110/78  01/04/21 110/72    Wt Readings from Last 3 Encounters:  07/04/22 165 lb (74.8 kg)  08/01/21 158 lb (71.7 kg)  06/12/21 160 lb (72.6 kg)       Physical Exam Constitutional: She appears well-developed and well-nourished. No distress.  HENT:  Head: Normocephalic and atraumatic.  Right Ear: External ear normal. Normal ear canal and TM Left Ear: External ear normal.  Normal ear canal and TM Mouth/Throat: Oropharynx is clear and moist.  Eyes: Conjunctivae normal.  Neck: Neck supple. No tracheal deviation present. No thyromegaly present.  No carotid bruit  Cardiovascular: Normal rate, regular rhythm and normal heart sounds.   No murmur heard.  No edema. Pulmonary/Chest: Effort normal and breath sounds normal. No respiratory distress. She has no wheezes. She has no rales.  Breast: deferred   Abdominal: Soft. She exhibits no distension. There is no tenderness.  Lymphadenopathy: She has no cervical adenopathy.  Skin: Skin is warm and dry. She is not diaphoretic.  Psychiatric: She has a normal mood  and affect. Her behavior is normal.     Lab Results  Component Value Date   WBC 7.0 06/12/2021   HGB 12.7 06/12/2021   HCT 37.8 06/12/2021   PLT 274.0 06/12/2021   GLUCOSE 77 06/12/2021   CHOL 212 (H) 06/12/2021   TRIG 134.0 06/12/2021   HDL 75.40 06/12/2021   LDLCALC 110 (H) 06/12/2021   ALT 15 06/12/2021   AST 23 06/12/2021   NA 140 06/12/2021   K 3.5 06/12/2021   CL 103 06/12/2021   CREATININE 0.74 06/12/2021   BUN 15 06/12/2021   CO2 27 06/12/2021   TSH 1.14 06/12/2021         Assessment & Plan:    Physical exam: Screening blood work  ordered Exercise  crossfit Weight  working on weight loss Substance abuse  none   Reviewed recommended immunizations.   Health Maintenance  Topic Date Due   HPV VACCINES (2 - 2-dose series) 06/23/2006   Hepatitis C Screening  Never done   INFLUENZA VACCINE  06/03/2022   PAP SMEAR-Modifier  02/21/2023   TETANUS/TDAP  04/03/2025   HIV Screening  Completed          See Problem List for Assessment and Plan of chronic medical problems.

## 2022-07-04 ENCOUNTER — Ambulatory Visit (INDEPENDENT_AMBULATORY_CARE_PROVIDER_SITE_OTHER): Payer: No Typology Code available for payment source | Admitting: Internal Medicine

## 2022-07-04 ENCOUNTER — Other Ambulatory Visit (HOSPITAL_COMMUNITY): Payer: Self-pay

## 2022-07-04 VITALS — BP 108/78 | HR 77 | Temp 98.0°F | Ht 66.0 in | Wt 165.0 lb

## 2022-07-04 DIAGNOSIS — Z0000 Encounter for general adult medical examination without abnormal findings: Secondary | ICD-10-CM

## 2022-07-04 DIAGNOSIS — E039 Hypothyroidism, unspecified: Secondary | ICD-10-CM | POA: Diagnosis not present

## 2022-07-04 LAB — CBC WITH DIFFERENTIAL/PLATELET
Basophils Absolute: 0 10*3/uL (ref 0.0–0.1)
Basophils Relative: 0.5 % (ref 0.0–3.0)
Eosinophils Absolute: 0.1 10*3/uL (ref 0.0–0.7)
Eosinophils Relative: 1.6 % (ref 0.0–5.0)
HCT: 37.4 % (ref 36.0–46.0)
Hemoglobin: 12.8 g/dL (ref 12.0–15.0)
Lymphocytes Relative: 32.1 % (ref 12.0–46.0)
Lymphs Abs: 2.3 10*3/uL (ref 0.7–4.0)
MCHC: 34.1 g/dL (ref 30.0–36.0)
MCV: 88 fl (ref 78.0–100.0)
Monocytes Absolute: 0.6 10*3/uL (ref 0.1–1.0)
Monocytes Relative: 8.1 % (ref 3.0–12.0)
Neutro Abs: 4.1 10*3/uL (ref 1.4–7.7)
Neutrophils Relative %: 57.7 % (ref 43.0–77.0)
Platelets: 265 10*3/uL (ref 150.0–400.0)
RBC: 4.25 Mil/uL (ref 3.87–5.11)
RDW: 12.4 % (ref 11.5–15.5)
WBC: 7 10*3/uL (ref 4.0–10.5)

## 2022-07-04 LAB — LIPID PANEL
Cholesterol: 195 mg/dL (ref 0–200)
HDL: 73.7 mg/dL (ref >39.00)
LDL Cholesterol: 108 mg/dL — ABNORMAL HIGH (ref 0–99)
NonHDL: 121.34
Total CHOL/HDL Ratio: 3
Triglycerides: 67 mg/dL (ref 0.0–149.0)
VLDL: 13.4 mg/dL (ref 0.0–40.0)

## 2022-07-04 LAB — COMPREHENSIVE METABOLIC PANEL
ALT: 13 U/L (ref 0–35)
AST: 21 U/L (ref 0–37)
Albumin: 4.1 g/dL (ref 3.5–5.2)
Alkaline Phosphatase: 57 U/L (ref 39–117)
BUN: 16 mg/dL (ref 6–23)
CO2: 25 mEq/L (ref 19–32)
Calcium: 9.4 mg/dL (ref 8.4–10.5)
Chloride: 105 mEq/L (ref 96–112)
Creatinine, Ser: 0.78 mg/dL (ref 0.40–1.20)
GFR: 101.18 mL/min (ref >60.00)
Glucose, Bld: 73 mg/dL (ref 70–99)
Potassium: 3.8 mEq/L (ref 3.5–5.1)
Sodium: 138 mEq/L (ref 135–145)
Total Bilirubin: 0.6 mg/dL (ref 0.2–1.2)
Total Protein: 7.6 g/dL (ref 6.0–8.3)

## 2022-07-04 LAB — TSH: TSH: 1.16 u[IU]/mL (ref 0.35–5.50)

## 2022-07-04 MED ORDER — ALBUTEROL SULFATE HFA 108 (90 BASE) MCG/ACT IN AERS
2.0000 | INHALATION_SPRAY | Freq: Four times a day (QID) | RESPIRATORY_TRACT | 8 refills | Status: AC | PRN
  Filled 2022-07-04: qty 13.4, 50d supply, fill #0

## 2022-07-04 NOTE — Assessment & Plan Note (Signed)
Chronic  Clinically euthyroid Check tsh and will titrate med dose if needed Currently taking levothyroxine 75 mcg daily  

## 2022-07-04 NOTE — Patient Instructions (Addendum)
Blood work was ordered.     Medications changes include :  none      Return in about 1 year (around 07/05/2023) for Physical Exam.   Health Maintenance, Female Adopting a healthy lifestyle and getting preventive care are important in promoting health and wellness. Ask your health care provider about: The right schedule for you to have regular tests and exams. Things you can do on your own to prevent diseases and keep yourself healthy. What should I know about diet, weight, and exercise? Eat a healthy diet  Eat a diet that includes plenty of vegetables, fruits, low-fat dairy products, and lean protein. Do not eat a lot of foods that are high in solid fats, added sugars, or sodium. Maintain a healthy weight Body mass index (BMI) is used to identify weight problems. It estimates body fat based on height and weight. Your health care provider can help determine your BMI and help you achieve or maintain a healthy weight. Get regular exercise Get regular exercise. This is one of the most important things you can do for your health. Most adults should: Exercise for at least 150 minutes each week. The exercise should increase your heart rate and make you sweat (moderate-intensity exercise). Do strengthening exercises at least twice a week. This is in addition to the moderate-intensity exercise. Spend less time sitting. Even light physical activity can be beneficial. Watch cholesterol and blood lipids Have your blood tested for lipids and cholesterol at 31 years of age, then have this test every 5 years. Have your cholesterol levels checked more often if: Your lipid or cholesterol levels are high. You are older than 31 years of age. You are at high risk for heart disease. What should I know about cancer screening? Depending on your health history and family history, you may need to have cancer screening at various ages. This may include screening for: Breast cancer. Cervical  cancer. Colorectal cancer. Skin cancer. Lung cancer. What should I know about heart disease, diabetes, and high blood pressure? Blood pressure and heart disease High blood pressure causes heart disease and increases the risk of stroke. This is more likely to develop in people who have high blood pressure readings or are overweight. Have your blood pressure checked: Every 3-5 years if you are 20-22 years of age. Every year if you are 32 years old or older. Diabetes Have regular diabetes screenings. This checks your fasting blood sugar level. Have the screening done: Once every three years after age 51 if you are at a normal weight and have a low risk for diabetes. More often and at a younger age if you are overweight or have a high risk for diabetes. What should I know about preventing infection? Hepatitis B If you have a higher risk for hepatitis B, you should be screened for this virus. Talk with your health care provider to find out if you are at risk for hepatitis B infection. Hepatitis C Testing is recommended for: Everyone born from [birth year] through 1965. Anyone with known risk factors for hepatitis C. Sexually transmitted infections (STIs) Get screened for STIs, including gonorrhea and chlamydia, if: You are sexually active and are younger than 31 years of age. You are older than 31 years of age and your health care provider tells you that you are at risk for this type of infection. Your sexual activity has changed since you were last screened, and you are at increased risk for chlamydia or gonorrhea. Ask your health  care provider if you are at risk. Ask your health care provider about whether you are at high risk for HIV. Your health care provider may recommend a prescription medicine to help prevent HIV infection. If you choose to take medicine to prevent HIV, you should first get tested for HIV. You should then be tested every 3 months for as long as you are taking the  medicine. Pregnancy If you are about to stop having your period (premenopausal) and you may become pregnant, seek counseling before you get pregnant. Take 400 to 800 micrograms (mcg) of folic acid every day if you become pregnant. Ask for birth control (contraception) if you want to prevent pregnancy. Osteoporosis and menopause Osteoporosis is a disease in which the bones lose minerals and strength with aging. This can result in bone fractures. If you are 70 years old or older, or if you are at risk for osteoporosis and fractures, ask your health care provider if you should: Be screened for bone loss. Take a calcium or vitamin D supplement to lower your risk of fractures. Be given hormone replacement therapy (HRT) to treat symptoms of menopause. Follow these instructions at home: Alcohol use Do not drink alcohol if: Your health care provider tells you not to drink. You are pregnant, may be pregnant, or are planning to become pregnant. If you drink alcohol: Limit how much you have to: 0-1 drink a day. Know how much alcohol is in your drink. In the [place] one drink equals one 12 oz bottle of beer (355 mL), one 5 oz glass of wine (148 mL), or one 1 oz glass of hard liquor (44 mL). Lifestyle Do not use any products that contain nicotine or tobacco. These products include cigarettes, chewing tobacco, and vaping devices, such as e-cigarettes. If you need help quitting, ask your health care provider. Do not use street drugs. Do not share needles. Ask your health care provider for help if you need support or information about quitting drugs. General instructions Schedule regular health, dental, and eye exams. Stay current with your vaccines. Tell your health care provider if: You often feel depressed. You have ever been abused or do not feel safe at home. Summary Adopting a healthy lifestyle and getting preventive care are important in promoting health and wellness. Follow your health care  provider's instructions about healthy diet, exercising, and getting tested or screened for diseases. Follow your health care provider's instructions on monitoring your cholesterol and blood pressure. This information is not intended to replace advice given to you by your health care provider. Make sure you discuss any questions you have with your health care provider. Document Revised: 03/11/2021 Document Reviewed: 03/11/2021 Elsevier Patient Education  [place].

## 2022-07-10 ENCOUNTER — Other Ambulatory Visit (HOSPITAL_COMMUNITY): Payer: Self-pay

## 2022-07-11 ENCOUNTER — Other Ambulatory Visit (HOSPITAL_COMMUNITY): Payer: Self-pay

## 2022-08-09 ENCOUNTER — Other Ambulatory Visit (HOSPITAL_COMMUNITY): Payer: Self-pay

## 2022-09-21 ENCOUNTER — Other Ambulatory Visit (HOSPITAL_COMMUNITY): Payer: Self-pay

## 2022-09-22 ENCOUNTER — Other Ambulatory Visit (HOSPITAL_COMMUNITY): Payer: Self-pay

## 2022-09-29 ENCOUNTER — Other Ambulatory Visit (HOSPITAL_COMMUNITY): Payer: Self-pay

## 2022-09-29 MED ORDER — AZELAIC ACID 15 % EX GEL
CUTANEOUS | 2 refills | Status: DC
  Filled 2022-09-29: qty 50, 30d supply, fill #0
  Filled 2023-01-28: qty 50, 30d supply, fill #1

## 2022-09-30 ENCOUNTER — Other Ambulatory Visit: Payer: Self-pay | Admitting: Internal Medicine

## 2022-09-30 ENCOUNTER — Other Ambulatory Visit (HOSPITAL_COMMUNITY): Payer: Self-pay

## 2022-09-30 DIAGNOSIS — E038 Other specified hypothyroidism: Secondary | ICD-10-CM

## 2022-09-30 MED ORDER — LEVOTHYROXINE SODIUM 75 MCG PO TABS
ORAL_TABLET | ORAL | 1 refills | Status: DC
  Filled 2022-09-30: qty 114, 90d supply, fill #0
  Filled 2023-01-14: qty 114, 90d supply, fill #1

## 2022-10-16 ENCOUNTER — Telehealth: Payer: No Typology Code available for payment source | Admitting: Family Medicine

## 2022-10-16 DIAGNOSIS — J069 Acute upper respiratory infection, unspecified: Secondary | ICD-10-CM

## 2022-10-16 MED ORDER — BENZONATATE 100 MG PO CAPS: 100.0000 mg | ORAL_CAPSULE | Freq: Two times a day (BID) | ORAL | 0 refills | Status: DC | PRN

## 2022-10-16 MED ORDER — FLUTICASONE PROPIONATE 50 MCG/ACT NA SUSP: 2.0000 | Freq: Every day | NASAL | 0 refills | Status: AC

## 2022-10-16 MED ORDER — PSEUDOEPH-BROMPHEN-DM 30-2-10 MG/5ML PO SYRP: 5.0000 mL | ORAL_SOLUTION | Freq: Four times a day (QID) | ORAL | 0 refills | Status: DC | PRN

## 2022-10-16 NOTE — Progress Notes (Signed)
E-Visit for Upper Respiratory Infection   We are sorry you are not feeling well.  Here is how we plan to help!  Based on what you have shared with me, it looks like you may have a viral upper respiratory infection.  Upper respiratory infections are caused by a large number of viruses; however, rhinovirus is the most common cause.   Symptoms vary from person to person, with common symptoms including sore throat, cough, fatigue or lack of energy and feeling of general discomfort.  A low-grade fever of up to 100.4 may present, but is often uncommon.  Symptoms vary however, and are closely related to a person's age or underlying illnesses.  The most common symptoms associated with an upper respiratory infection are nasal discharge or congestion, cough, sneezing, headache and pressure in the ears and face.  These symptoms usually persist for about 3 to 10 days, but can last up to 2 weeks.  It is important to know that upper respiratory infections do not cause serious illness or complications in most cases.    Upper respiratory infections can be transmitted from person to person, with the most common method of transmission being a person's hands.  The virus is able to live on the skin and can infect other persons for up to 2 hours after direct contact.  Also, these can be transmitted when someone coughs or sneezes; thus, it is important to cover the mouth to reduce this risk.  To keep the spread of the illness at [place], good hand hygiene is very important.  This is an infection that is most likely caused by a virus. There are no specific treatments other than to help you with the symptoms until the infection runs its course.  We are sorry you are not feeling well.  Here is how we plan to help!   For nasal congestion, you may use an oral decongestants such as Mucinex D or if you have glaucoma or high blood pressure use plain Mucinex.  Saline nasal spray or nasal drops can help and can safely be used as often as  needed for congestion.  For your congestion, I have prescribed Fluticasone nasal spray one spray in each nostril twice a day  If you do not have a history of heart disease, hypertension, diabetes or thyroid disease, prostate/bladder issues or glaucoma, you may also use Sudafed to treat nasal congestion.  It is highly recommended that you consult with a pharmacist or your primary care physician to ensure this medication is safe for you to take.     If you have a cough, you may use cough suppressants such as Delsym and Robitussin.  If you have glaucoma or high blood pressure, you can also use Coricidin HBP.   For cough I have prescribed for you A prescription cough medication called Tessalon Perles 100 mg. You may take 1-2 capsules every 8 hours as needed for cough I will also send in a cough syrup to be taken as directed.    If you have a sore or scratchy throat, use a saltwater gargle-  to  teaspoon of salt dissolved in a 4-ounce to 8-ounce glass of warm water.  Gargle the solution for approximately 15-30 seconds and then spit.  It is important not to swallow the solution.  You can also use throat lozenges/cough drops and Chloraseptic spray to help with throat pain or discomfort.  Warm or cold liquids can also be helpful in relieving throat pain.  For headache, pain or  general discomfort, you can use Ibuprofen or Tylenol as directed.   Some authorities believe that zinc sprays or the use of Echinacea may shorten the course of your symptoms.   HOME CARE Only take medications as instructed by your medical team. Be sure to drink plenty of fluids. Water is fine as well as fruit juices, sodas and electrolyte beverages. You may want to stay away from caffeine or alcohol. If you are nauseated, try taking small sips of liquids. How do you know if you are getting enough fluid? Your urine should be a pale yellow or almost colorless. Get rest. Taking a steamy shower or using a humidifier may help nasal  congestion and ease sore throat pain. You can place a towel over your head and breathe in the steam from hot water coming from a faucet. Using a saline nasal spray works much the same way. Cough drops, hard candies and sore throat lozenges may ease your cough. Avoid close contacts especially the very young and the elderly Cover your mouth if you cough or sneeze Always remember to wash your hands.   GET HELP RIGHT AWAY IF: You develop worsening fever. If your symptoms do not improve within 10 days You develop yellow or green discharge from your nose over 3 days. You have coughing fits You develop a severe head ache or visual changes. You develop shortness of breath, difficulty breathing or start having chest pain Your symptoms persist after you have completed your treatment plan  MAKE SURE YOU  Understand these instructions. Will watch your condition. Will get help right away if you are not doing well or get worse.  Thank you for choosing an e-visit.  Your e-visit answers were reviewed by a board certified advanced clinical practitioner to complete your personal care plan. Depending upon the condition, your plan could have included both over the counter or prescription medications.  Please review your pharmacy choice. Make sure the pharmacy is open so you can pick up prescription now. If there is a problem, you may contact your provider through [employer] and have the prescription routed to another pharmacy.  Your safety is important to [place]. If you have drug allergies check your prescription carefully.   For the next 24 hours you can use MyChart to ask questions about today's visit, request a non-urgent call back, or ask for a work or school excuse. You will get an email in the next two days asking about your experience. I hope that your e-visit has been valuable and will speed your recovery.      I provided 5 minutes of non face-to-face time during this encounter for chart  review, medication and order placement, as well as and documentation.

## 2022-12-11 ENCOUNTER — Other Ambulatory Visit (HOSPITAL_COMMUNITY): Payer: Self-pay

## 2022-12-30 ENCOUNTER — Other Ambulatory Visit (HOSPITAL_COMMUNITY): Payer: Self-pay

## 2022-12-30 MED ORDER — METRONIDAZOLE 0.75 % EX GEL
1.0000 | Freq: Two times a day (BID) | CUTANEOUS | 0 refills | Status: AC
  Filled 2022-12-30: qty 45, 23d supply, fill #0

## 2023-01-14 ENCOUNTER — Other Ambulatory Visit (HOSPITAL_COMMUNITY): Payer: Self-pay

## 2023-01-23 ENCOUNTER — Telehealth: Payer: 59 | Admitting: Family Medicine

## 2023-01-23 DIAGNOSIS — H44009 Unspecified purulent endophthalmitis, unspecified eye: Secondary | ICD-10-CM

## 2023-01-23 MED ORDER — POLYMYXIN B-TRIMETHOPRIM 10000-0.1 UNIT/ML-% OP SOLN: 1.0000 [drp] | Freq: Four times a day (QID) | OPHTHALMIC | 0 refills | Status: DC

## 2023-01-23 NOTE — Progress Notes (Signed)

## 2023-01-28 ENCOUNTER — Other Ambulatory Visit: Payer: Self-pay

## 2023-01-29 ENCOUNTER — Other Ambulatory Visit (HOSPITAL_COMMUNITY): Payer: Self-pay

## 2023-02-16 ENCOUNTER — Encounter: Payer: Self-pay | Admitting: *Deleted

## 2023-02-25 DIAGNOSIS — Z01419 Encounter for gynecological examination (general) (routine) without abnormal findings: Secondary | ICD-10-CM | POA: Diagnosis not present

## 2023-02-25 DIAGNOSIS — Z130 Encounter for screening for diseases of the blood and blood-forming organs and certain disorders involving the immune mechanism: Secondary | ICD-10-CM | POA: Diagnosis not present

## 2023-02-25 DIAGNOSIS — Z124 Encounter for screening for malignant neoplasm of cervix: Secondary | ICD-10-CM | POA: Diagnosis not present

## 2023-02-25 DIAGNOSIS — Z113 Encounter for screening for infections with a predominantly sexual mode of transmission: Secondary | ICD-10-CM | POA: Diagnosis not present

## 2023-02-25 DIAGNOSIS — Z1389 Encounter for screening for other disorder: Secondary | ICD-10-CM | POA: Diagnosis not present

## 2023-04-30 ENCOUNTER — Other Ambulatory Visit (HOSPITAL_COMMUNITY): Payer: Self-pay

## 2023-04-30 ENCOUNTER — Other Ambulatory Visit: Payer: Self-pay | Admitting: Internal Medicine

## 2023-04-30 ENCOUNTER — Other Ambulatory Visit (HOSPITAL_BASED_OUTPATIENT_CLINIC_OR_DEPARTMENT_OTHER): Payer: Self-pay

## 2023-04-30 DIAGNOSIS — E038 Other specified hypothyroidism: Secondary | ICD-10-CM

## 2023-04-30 MED ORDER — LEVOTHYROXINE SODIUM 75 MCG PO TABS
75.0000 ug | ORAL_TABLET | Freq: Every day | ORAL | 3 refills | Status: DC
  Filled 2023-04-30: qty 114, 84d supply, fill #0
  Filled 2023-09-02: qty 114, 84d supply, fill #1
  Filled 2023-12-21: qty 114, 84d supply, fill #2
  Filled 2024-04-01: qty 36, 28d supply, fill #3

## 2023-05-01 ENCOUNTER — Other Ambulatory Visit: Payer: Self-pay

## 2023-05-05 ENCOUNTER — Other Ambulatory Visit (HOSPITAL_COMMUNITY): Payer: Self-pay

## 2023-05-22 ENCOUNTER — Other Ambulatory Visit (HOSPITAL_COMMUNITY): Payer: Self-pay

## 2023-05-26 ENCOUNTER — Other Ambulatory Visit: Payer: Self-pay

## 2023-05-26 ENCOUNTER — Other Ambulatory Visit (HOSPITAL_COMMUNITY): Payer: Self-pay

## 2023-05-26 MED ORDER — METRONIDAZOLE 0.75 % EX GEL
Freq: Two times a day (BID) | CUTANEOUS | 0 refills | Status: DC
  Filled 2023-05-26: qty 45, 30d supply, fill #0

## 2023-05-28 ENCOUNTER — Other Ambulatory Visit (HOSPITAL_COMMUNITY): Payer: Self-pay

## 2023-08-06 ENCOUNTER — Encounter: Payer: Self-pay | Admitting: Internal Medicine

## 2023-08-06 NOTE — Progress Notes (Signed)
Subjective:    Patient ID: Patricia [name], female    DOB: [birth year], 32 y.o.   MRN: [id number]      HPI Patricia [name] is here for a Physical exam and her chronic medical problems.      Medications and allergies reviewed with patient and updated if appropriate.  Current Outpatient Medications on File Prior to Visit  Medication Sig Dispense Refill   Adapalene 0.3 % gel Apply topically to face at nigh 45 g 5   albuterol (VENTOLIN HFA) 108 (90 Base) MCG/ACT inhaler Inhale 2 puffs into the lungs every 6 (six) hours as needed for wheezing or shortness of breath (prior to exercise). 8 g 8   Azelaic Acid 15 % gel Apply to face every morning as directed 50 g 2   brompheniramine-pseudoephedrine-DM 30-2-10 MG/5ML syrup Take 5 mLs by mouth 4 (four) times daily as needed. 120 mL 0   Cholecalciferol (VITAMIN D) 50 MCG (2000 UT) CAPS Take 1 capsule (2,000 Units total) by mouth daily. 30 capsule    clindamycin (CLEOCIN T) 1 % external solution Apply topically 2 (two) times daily. 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 16 g 0   levothyroxine (SYNTHROID) 75 MCG tablet Take one tablet (75 mcg) 5 days a week before breakfast, take two tablets (150 mcg) 2 days a week. 114 tablet 3   LO LOESTRIN FE 1 MG-10 MCG / 10 MCG tablet Take 1 tablet by mouth daily. 84 tablet 3   meloxicam (MOBIC) 15 MG tablet Take 1 tablet (15 mg total) by mouth daily. 30 tablet 0   metroNIDAZOLE (METROGEL) 0.75 % gel Apply 1 Application topically to face 2 (two) times daily. 45 g 0   metroNIDAZOLE (METROGEL) 0.75 % gel Apply to face as directed 2 times daily 45 g 0   trimethoprim-polymyxin b (POLYTRIM) ophthalmic solution Place 1 drop into the right eye in the morning, at noon, in the evening, and at bedtime. 10 mL 0   gabapentin  (NEURONTIN) 100 MG capsule TAKE 2 CAPSULES BY MOUTH AT BEDTIME 180 capsule 3   No current facility-administered medications on file prior to visit.    Review of Systems  Constitutional:  Negative for fever.  Eyes:  Negative for visual disturbance.  Respiratory:  Negative for cough, shortness of breath and wheezing.   Cardiovascular:  Negative for chest pain, palpitations and leg swelling.  Gastrointestinal:  Negative for abdominal pain, blood in stool, constipation and diarrhea.       No gerd  Genitourinary:  Negative for dysuria.  Musculoskeletal:  Positive for arthralgias and back pain.  Skin:  Negative for rash.  Neurological:  Negative for light-headedness and headaches.  Psychiatric/Behavioral:  Negative for dysphoric mood. The patient is not nervous/anxious.        Objective:   Vitals:   08/07/23 1257  BP: 106/68  Pulse: 74  Temp: 97.9 F (36.6 C)  SpO2: 99%   Filed Weights   08/07/23 1257  Weight: 170 lb (77.1 kg)   Body mass index is 27.44 kg/m.  BP Readings from Last 3 Encounters:  08/07/23 106/68  07/04/22 108/78  06/12/21 110/78    Wt Readings from Last 3 Encounters:  08/07/23 170 lb (77.1 kg)  07/04/22 165 lb (74.8 kg)  08/01/21 158 lb (71.7 kg)  Physical Exam Constitutional: She appears well-developed and well-nourished. No distress.  HENT:  Head: Normocephalic and atraumatic.  Right Ear: External ear normal. Normal ear canal and TM Left Ear: External ear normal.  Normal ear canal and TM Mouth/Throat: Oropharynx is clear and moist.  Eyes: Conjunctivae normal.  Neck: Neck supple. No tracheal deviation present. No thyromegaly present.  No carotid bruit  Cardiovascular: Normal rate, regular rhythm and normal heart sounds.   No murmur heard.  No edema. Pulmonary/Chest: Effort normal and breath sounds normal. No respiratory distress. She has no wheezes. She has no rales.  Breast: deferred   Abdominal: Soft. She exhibits no distension.  There is no tenderness.  Lymphadenopathy: She has no cervical adenopathy.  Skin: Skin is warm and dry. She is not diaphoretic.  Psychiatric: She has a normal mood and affect. Her behavior is normal.     Lab Results  Component Value Date   WBC 7.0 07/04/2022   HGB 12.8 07/04/2022   HCT 37.4 07/04/2022   PLT 265.0 07/04/2022   GLUCOSE 73 07/04/2022   CHOL 195 07/04/2022   TRIG 67.0 07/04/2022   HDL 73.70 07/04/2022   LDLCALC 108 (H) 07/04/2022   ALT 13 07/04/2022   AST 21 07/04/2022   NA 138 07/04/2022   K 3.8 07/04/2022   CL 105 07/04/2022   CREATININE 0.78 07/04/2022   BUN 16 07/04/2022   CO2 25 07/04/2022   TSH 1.16 07/04/2022         Assessment & Plan:   Physical exam: Screening blood work  ordered Exercise  regular Weight  ok for age Substance abuse  none   Reviewed recommended immunizations.   Health Maintenance  Topic Date Due   Cervical Cancer Screening (HPV/Pap Cotest)  02/21/2023   COVID-19 Vaccine (1 - 2023-24 season) 08/23/2023 (Originally 07/05/2023)   DTaP/Tdap/Td (8 - Td or Tdap) 04/03/2025   INFLUENZA VACCINE  Completed   HIV Screening  Completed   HPV VACCINES  Discontinued   Hepatitis C Screening  Discontinued          See Problem List for Assessment and Plan of chronic medical problems.

## 2023-08-06 NOTE — Patient Instructions (Addendum)
Blood work was ordered.   The lab is on the first floor.    Medications changes include :   none     Return in about 1 year (around 08/06/2024) for Physical Exam.   Health Maintenance, Female Adopting a healthy lifestyle and getting preventive care are important in promoting health and wellness. Ask your health care provider about: The right schedule for you to have regular tests and exams. Things you can do on your own to prevent diseases and keep yourself healthy. What should I know about diet, weight, and exercise? Eat a healthy diet  Eat a diet that includes plenty of vegetables, fruits, low-fat dairy products, and lean protein. Do not eat a lot of foods that are high in solid fats, added sugars, or sodium. Maintain a healthy weight Body mass index (BMI) is used to identify weight problems. It estimates body fat based on height and weight. Your health care provider can help determine your BMI and help you achieve or maintain a healthy weight. Get regular exercise Get regular exercise. This is one of the most important things you can do for your health. Most adults should: Exercise for at least 150 minutes each week. The exercise should increase your heart rate and make you sweat (moderate-intensity exercise). Do strengthening exercises at least twice a week. This is in addition to the moderate-intensity exercise. Spend less time sitting. Even light physical activity can be beneficial. Watch cholesterol and blood lipids Have your blood tested for lipids and cholesterol at 32 years of age, then have this test every 5 years. Have your cholesterol levels checked more often if: Your lipid or cholesterol levels are high. You are older than 32 years of age. You are at high risk for heart disease. What should I know about cancer screening? Depending on your health history and family history, you may need to have cancer screening at various ages. This may include screening  for: Breast cancer. Cervical cancer. Colorectal cancer. Skin cancer. Lung cancer. What should I know about heart disease, diabetes, and high blood pressure? Blood pressure and heart disease High blood pressure causes heart disease and increases the risk of stroke. This is more likely to develop in people who have high blood pressure readings or are overweight. Have your blood pressure checked: Every 3-5 years if you are 72-4 years of age. Every year if you are 33 years old or older. Diabetes Have regular diabetes screenings. This checks your fasting blood sugar level. Have the screening done: Once every three years after age 75 if you are at a normal weight and have a low risk for diabetes. More often and at a younger age if you are overweight or have a high risk for diabetes. What should I know about preventing infection? Hepatitis B If you have a higher risk for hepatitis B, you should be screened for this virus. Talk with your health care provider to find out if you are at risk for hepatitis B infection. Hepatitis C Testing is recommended for: Everyone born from [birth year] through 1965. Anyone with known risk factors for hepatitis C. Sexually transmitted infections (STIs) Get screened for STIs, including gonorrhea and chlamydia, if: You are sexually active and are younger than 32 years of age. You are older than 33 years of age and your health care provider tells you that you are at risk for this type of infection. Your sexual activity has changed since you were last screened, and you are at  increased risk for chlamydia or gonorrhea. Ask your health care provider if you are at risk. Ask your health care provider about whether you are at high risk for HIV. Your health care provider may recommend a prescription medicine to help prevent HIV infection. If you choose to take medicine to prevent HIV, you should first get tested for HIV. You should then be tested every 3 months for as long as you  are taking the medicine. Pregnancy If you are about to stop having your period (premenopausal) and you may become pregnant, seek counseling before you get pregnant. Take 400 to 800 micrograms (mcg) of folic acid every day if you become pregnant. Ask for birth control (contraception) if you want to prevent pregnancy. Osteoporosis and menopause Osteoporosis is a disease in which the bones lose minerals and strength with aging. This can result in bone fractures. If you are 32 years old or older, or if you are at risk for osteoporosis and fractures, ask your health care provider if you should: Be screened for bone loss. Take a calcium or vitamin D supplement to lower your risk of fractures. Be given hormone replacement therapy (HRT) to treat symptoms of menopause. Follow these instructions at home: Alcohol use Do not drink alcohol if: Your health care provider tells you not to drink. You are pregnant, may be pregnant, or are planning to become pregnant. If you drink alcohol: Limit how much you have to: 0-1 drink a day. Know how much alcohol is in your drink. In the [place] one drink equals one 12 oz bottle of beer (355 mL), one 5 oz glass of wine (148 mL), or one 1 oz glass of hard liquor (44 mL). Lifestyle Do not use any products that contain nicotine or tobacco. These products include cigarettes, chewing tobacco, and vaping devices, such as e-cigarettes. If you need help quitting, ask your health care provider. Do not use street drugs. Do not share needles. Ask your health care provider for help if you need support or information about quitting drugs. General instructions Schedule regular health, dental, and eye exams. Stay current with your vaccines. Tell your health care provider if: You often feel depressed. You have ever been abused or do not feel safe at home. Summary Adopting a healthy lifestyle and getting preventive care are important in promoting health and wellness. Follow your  health care provider's instructions about healthy diet, exercising, and getting tested or screened for diseases. Follow your health care provider's instructions on monitoring your cholesterol and blood pressure. This information is not intended to replace advice given to you by your health care provider. Make sure you discuss any questions you have with your health care provider. Document Revised: 03/11/2021 Document Reviewed: 03/11/2021 Elsevier Patient Education  2024 [employer].

## 2023-08-07 ENCOUNTER — Encounter: Payer: Self-pay | Admitting: Internal Medicine

## 2023-08-07 ENCOUNTER — Ambulatory Visit (INDEPENDENT_AMBULATORY_CARE_PROVIDER_SITE_OTHER): Payer: 59 | Admitting: Internal Medicine

## 2023-08-07 VITALS — BP 106/68 | HR 74 | Temp 97.9°F | Ht 66.0 in | Wt 170.0 lb

## 2023-08-07 DIAGNOSIS — E039 Hypothyroidism, unspecified: Secondary | ICD-10-CM

## 2023-08-07 DIAGNOSIS — Z0000 Encounter for general adult medical examination without abnormal findings: Secondary | ICD-10-CM | POA: Diagnosis not present

## 2023-08-07 LAB — CBC WITH DIFFERENTIAL/PLATELET
Basophils Absolute: 0 10*3/uL (ref 0.0–0.1)
Basophils Relative: 0.4 % (ref 0.0–3.0)
Eosinophils Absolute: 0.2 10*3/uL (ref 0.0–0.7)
Eosinophils Relative: 1.6 % (ref 0.0–5.0)
HCT: 41.4 % (ref 36.0–46.0)
Hemoglobin: 13.5 g/dL (ref 12.0–15.0)
Lymphocytes Relative: 13.7 % (ref 12.0–46.0)
Lymphs Abs: 1.5 10*3/uL (ref 0.7–4.0)
MCHC: 32.6 g/dL (ref 30.0–36.0)
MCV: 88.3 fL (ref 78.0–100.0)
Monocytes Absolute: 0.7 10*3/uL (ref 0.1–1.0)
Monocytes Relative: 6.6 % (ref 3.0–12.0)
Neutro Abs: 8.6 10*3/uL — ABNORMAL HIGH (ref 1.4–7.7)
Neutrophils Relative %: 77.7 % — ABNORMAL HIGH (ref 43.0–77.0)
Platelets: 319 10*3/uL (ref 150.0–400.0)
RBC: 4.69 Mil/uL (ref 3.87–5.11)
RDW: 12.6 % (ref 11.5–15.5)
WBC: 11 10*3/uL — ABNORMAL HIGH (ref 4.0–10.5)

## 2023-08-07 LAB — COMPREHENSIVE METABOLIC PANEL
ALT: 16 U/L (ref 0–35)
AST: 21 U/L (ref 0–37)
Albumin: 4.5 g/dL (ref 3.5–5.2)
Alkaline Phosphatase: 89 U/L (ref 39–117)
BUN: 13 mg/dL (ref 6–23)
CO2: 27 meq/L (ref 19–32)
Calcium: 9.3 mg/dL (ref 8.4–10.5)
Chloride: 102 meq/L (ref 96–112)
Creatinine, Ser: 0.86 mg/dL (ref 0.40–1.20)
GFR: 89.31 mL/min (ref >60.00)
Glucose, Bld: 80 mg/dL (ref 70–99)
Potassium: 3.9 meq/L (ref 3.5–5.1)
Sodium: 138 meq/L (ref 135–145)
Total Bilirubin: 0.7 mg/dL (ref 0.2–1.2)
Total Protein: 7.7 g/dL (ref 6.0–8.3)

## 2023-08-07 LAB — LIPID PANEL
Cholesterol: 189 mg/dL (ref 0–200)
HDL: 68.9 mg/dL (ref >39.00)
LDL Cholesterol: 106 mg/dL — ABNORMAL HIGH (ref 0–99)
NonHDL: 119.62
Total CHOL/HDL Ratio: 3
Triglycerides: 66 mg/dL (ref 0.0–149.0)
VLDL: 13.2 mg/dL (ref 0.0–40.0)

## 2023-08-07 LAB — TSH: TSH: 2.92 u[IU]/mL (ref 0.35–5.50)

## 2023-08-07 NOTE — Assessment & Plan Note (Signed)
Chronic  Clinically euthyroid Check tsh and will titrate med dose if needed Currently taking levothyroxine 75 mcg daily  

## 2023-09-02 ENCOUNTER — Other Ambulatory Visit (HOSPITAL_COMMUNITY): Payer: Self-pay

## 2023-09-02 ENCOUNTER — Other Ambulatory Visit: Payer: Self-pay

## 2023-09-30 ENCOUNTER — Other Ambulatory Visit (HOSPITAL_COMMUNITY): Payer: Self-pay

## 2023-09-30 DIAGNOSIS — L218 Other seborrheic dermatitis: Secondary | ICD-10-CM | POA: Diagnosis not present

## 2023-09-30 DIAGNOSIS — D225 Melanocytic nevi of trunk: Secondary | ICD-10-CM | POA: Diagnosis not present

## 2023-09-30 DIAGNOSIS — L710 Perioral dermatitis: Secondary | ICD-10-CM | POA: Diagnosis not present

## 2023-09-30 DIAGNOSIS — L821 Other seborrheic keratosis: Secondary | ICD-10-CM | POA: Diagnosis not present

## 2023-09-30 DIAGNOSIS — L718 Other rosacea: Secondary | ICD-10-CM | POA: Diagnosis not present

## 2023-09-30 DIAGNOSIS — L814 Other melanin hyperpigmentation: Secondary | ICD-10-CM | POA: Diagnosis not present

## 2023-09-30 MED ORDER — DOXYCYCLINE HYCLATE 100 MG PO TABS
100.0000 mg | ORAL_TABLET | Freq: Every day | ORAL | 1 refills | Status: DC
  Filled 2023-09-30: qty 30, 30d supply, fill #0
  Filled 2023-10-31: qty 30, 30d supply, fill #1

## 2023-09-30 MED ORDER — KETOCONAZOLE 2 % EX SHAM
MEDICATED_SHAMPOO | CUTANEOUS | 2 refills | Status: AC
  Filled 2023-09-30: qty 120, 30d supply, fill #0
  Filled 2023-12-21: qty 120, 30d supply, fill #1

## 2023-09-30 MED ORDER — AZELAIC ACID 15 % EX GEL
1.0000 | Freq: Every morning | CUTANEOUS | 3 refills | Status: DC
  Filled 2023-09-30: qty 50, 30d supply, fill #0

## 2023-09-30 MED ORDER — METRONIDAZOLE 0.75 % EX GEL
1.0000 | Freq: Two times a day (BID) | CUTANEOUS | 3 refills | Status: DC
  Filled 2023-09-30: qty 45, 23d supply, fill #0

## 2023-11-01 ENCOUNTER — Other Ambulatory Visit (HOSPITAL_COMMUNITY): Payer: Self-pay

## 2023-11-19 ENCOUNTER — Other Ambulatory Visit (HOSPITAL_COMMUNITY): Payer: Self-pay

## 2023-11-19 ENCOUNTER — Telehealth: Payer: 59 | Admitting: Physician Assistant

## 2023-11-19 DIAGNOSIS — B9689 Other specified bacterial agents as the cause of diseases classified elsewhere: Secondary | ICD-10-CM | POA: Diagnosis not present

## 2023-11-19 DIAGNOSIS — J019 Acute sinusitis, unspecified: Secondary | ICD-10-CM | POA: Diagnosis not present

## 2023-11-19 MED ORDER — BENZONATATE 100 MG PO CAPS
100.0000 mg | ORAL_CAPSULE | Freq: Three times a day (TID) | ORAL | 0 refills | Status: DC | PRN
  Filled 2023-11-19: qty 30, 5d supply, fill #0

## 2023-11-19 MED ORDER — AZITHROMYCIN 250 MG PO TABS
ORAL_TABLET | ORAL | 0 refills | Status: AC
  Filled 2023-11-19: qty 6, 5d supply, fill #0

## 2023-11-19 NOTE — Progress Notes (Signed)

## 2023-12-22 ENCOUNTER — Other Ambulatory Visit (HOSPITAL_COMMUNITY): Payer: Self-pay

## 2023-12-22 ENCOUNTER — Other Ambulatory Visit: Payer: Self-pay

## 2024-03-22 ENCOUNTER — Other Ambulatory Visit: Payer: Self-pay

## 2024-03-23 ENCOUNTER — Telehealth: Payer: Self-pay

## 2024-03-23 DIAGNOSIS — Z0184 Encounter for antibody response examination: Secondary | ICD-10-CM

## 2024-03-23 NOTE — Telephone Encounter (Signed)
 Ordered

## 2024-03-24 ENCOUNTER — Other Ambulatory Visit: Payer: PRIVATE HEALTH INSURANCE

## 2024-03-24 DIAGNOSIS — Z0184 Encounter for antibody response examination: Secondary | ICD-10-CM

## 2024-03-29 LAB — VARICELLA-ZOSTER VIRUS AB(IMMUNITY SCREEN),ACIF,SERUM: VARICELLA ZOSTER VIRUS AB (IMMUNITY SCR),ACIF SERUM: >=1:4 {titer}

## 2024-03-30 ENCOUNTER — Ambulatory Visit: Payer: Self-pay | Admitting: Internal Medicine

## 2024-04-01 ENCOUNTER — Other Ambulatory Visit: Payer: Self-pay

## 2024-04-06 ENCOUNTER — Telehealth: Payer: PRIVATE HEALTH INSURANCE | Admitting: Physician Assistant

## 2024-04-06 DIAGNOSIS — B9689 Other specified bacterial agents as the cause of diseases classified elsewhere: Secondary | ICD-10-CM | POA: Diagnosis not present

## 2024-04-06 DIAGNOSIS — J208 Acute bronchitis due to other specified organisms: Secondary | ICD-10-CM

## 2024-04-06 MED ORDER — AZITHROMYCIN 250 MG PO TABS: ORAL_TABLET | ORAL | 0 refills | Status: AC

## 2024-04-06 MED ORDER — PSEUDOEPH-BROMPHEN-DM 30-2-10 MG/5ML PO SYRP: 5.0000 mL | ORAL_SOLUTION | Freq: Four times a day (QID) | ORAL | 0 refills | Status: DC | PRN

## 2024-04-06 NOTE — Progress Notes (Signed)
 E-Visit for Cough   We are sorry that you are not feeling well.  Here is how we plan to help!  Based on your presentation I believe you most likely have A cough due to bacteria.  When patients have a fever and a productive cough with a change in color or increased sputum production, we are concerned about bacterial bronchitis.  If left untreated it can progress to pneumonia.  If your symptoms do not improve with your treatment plan it is important that you contact your provider.   I have prescribed Azithromyin 250 mg: two tablets now and then one tablet daily for 4 additonal days    In addition you may use a prescription cough syrup called Bromfed DM Take 5mL every 6 hours as needed for cough  From your responses in the eVisit questionnaire you describe inflammation in the upper respiratory tract which is causing a significant cough.  This is commonly called Bronchitis and has four common causes:   Allergies Viral Infections Acid Reflux Bacterial Infection Allergies, viruses and acid reflux are treated by controlling symptoms or eliminating the cause. An example might be a cough caused by taking certain blood pressure medications. You stop the cough by changing the medication. Another example might be a cough caused by acid reflux. Controlling the reflux helps control the cough.  USE OF BRONCHODILATOR ("RESCUE") INHALERS: There is a risk from using your bronchodilator too frequently.  The risk is that over-reliance on a medication which only relaxes the muscles surrounding the breathing tubes can reduce the effectiveness of medications prescribed to reduce swelling and congestion of the tubes themselves.  Although you feel brief relief from the bronchodilator inhaler, your asthma may actually be worsening with the tubes becoming more swollen and filled with mucus.  This can delay other crucial treatments, such as oral steroid medications. If you need to use a bronchodilator inhaler daily, several  times per day, you should discuss this with your provider.  There are probably better treatments that could be used to keep your asthma under control.     HOME CARE Only take medications as instructed by your medical team. Complete the entire course of an antibiotic. Drink plenty of fluids and get plenty of rest. Avoid close contacts especially the very young and the elderly Cover your mouth if you cough or cough into your sleeve. Always remember to wash your hands A steam or ultrasonic humidifier can help congestion.   GET HELP RIGHT AWAY IF: You develop worsening fever. You become short of breath You cough up blood. Your symptoms persist after you have completed your treatment plan MAKE SURE YOU  Understand these instructions. Will watch your condition. Will get help right away if you are not doing well or get worse.    Thank you for choosing an e-visit.  Your e-visit answers were reviewed by a board certified advanced clinical practitioner to complete your personal care plan. Depending upon the condition, your plan could have included both over the counter or prescription medications.  Please review your pharmacy choice. Make sure the pharmacy is open so you can pick up prescription now. If there is a problem, you may contact your provider through [employer] and have the prescription routed to another pharmacy.  Your safety is important to us . If you have drug allergies check your prescription carefully.   For the next 24 hours you can use MyChart to ask questions about today's visit, request a non-urgent call back, or ask for  a work or school excuse. You will get an email in the next two days asking about your experience. I hope that your e-visit has been valuable and will speed your recovery.    I have spent 5 minutes in review of e-visit questionnaire, review and updating patient chart, medical decision making and response to patient.   Angelia Kelp, PA-C

## 2024-07-06 ENCOUNTER — Encounter: Payer: Self-pay | Admitting: Internal Medicine

## 2024-07-06 NOTE — Progress Notes (Unsigned)
 Subjective:    Patient ID: Patricia [name], female    DOB: 27-Jan-1991, 33 y.o.   MRN: [id number]      HPI Patricia [name] is here for a Physical exam and her chronic medical problems.   Overall doing well.     Medications and allergies reviewed with patient and updated if appropriate.  Current Outpatient Medications on File Prior to Visit  Medication Sig Dispense Refill   Adapalene  0.3 % gel Apply topically to face at nigh 45 g 5   albuterol  (VENTOLIN  HFA) 108 (90 Base) MCG/ACT inhaler Inhale 2 puffs into the lungs every 6 (six) hours as needed for wheezing or shortness of breath (prior to exercise). 8 g 8   clindamycin  (CLEOCIN  T) 1 % external solution Apply topically 2 (two) times daily. 30 mL 5   doxycycline  (PERIOSTAT ) 20 MG tablet Take 20 mg by mouth 2 (two) times daily.     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 16 g 0   ketoconazole  (NIZORAL ) 2 % shampoo Apply to damp scalp and body, lather, let sit 5-10 minutes and rinse. Use around 1-2 times a week 120 mL 2   levothyroxine  (SYNTHROID ) 75 MCG tablet Take one tablet (75 mcg) 5 days a week before breakfast, take two tablets (150 mcg) 2 days a week. 114 tablet 3   meloxicam  (MOBIC ) 15 MG tablet Take 1 tablet (15 mg total) by mouth daily. 30 tablet 0   metroNIDAZOLE  (METROGEL ) 0.75 % gel Apply 1 Application topically to face 2 (two) times daily. 45 g 0   gabapentin  (NEURONTIN ) 100 MG capsule TAKE 2 CAPSULES BY MOUTH AT BEDTIME (Patient not taking: Reported on 07/07/2024) 180 capsule 3   No current facility-administered medications on file prior to visit.    Review of Systems  Constitutional:  Negative for fever.  Eyes:  Negative for visual disturbance.  Respiratory:  Negative for cough, shortness of breath and wheezing.   Cardiovascular:  Negative  for chest pain, palpitations and leg swelling.  Gastrointestinal:  Negative for abdominal pain, blood in stool, constipation and diarrhea.       Freq gerd  Genitourinary:  Negative for dysuria.  Musculoskeletal:  Positive for arthralgias and back pain.  Skin:  Negative for rash.  Neurological:  Negative for light-headedness and headaches.  Psychiatric/Behavioral:  Negative for dysphoric mood. The patient is not nervous/anxious.        Objective:   Vitals:   07/07/24 0814  BP: 114/72  Pulse: 78  Temp: 98.6 F (37 C)  SpO2: 98%   Filed Weights   07/07/24 0814  Weight: 172 lb (78 kg)   Body mass index is 27.76 kg/m.  BP Readings from Last 3 Encounters:  07/07/24 114/72  08/07/23 106/68  07/04/22 108/78    Wt Readings from Last 3 Encounters:  07/07/24 172 lb (78 kg)  08/07/23 170 lb (77.1 kg)  07/04/22 165 lb (74.8 kg)       Physical Exam Constitutional: She appears well-developed and well-nourished. No distress.  HENT:  Head: Normocephalic and atraumatic.  Right Ear: External ear normal. Normal ear canal and TM Left Ear: External ear normal.  Normal ear canal and TM Mouth/Throat: Oropharynx is clear and moist.  Eyes: Conjunctivae normal.  Neck: Neck supple. No tracheal deviation present. No thyromegaly present.  No carotid bruit  Cardiovascular:  Normal rate, regular rhythm and normal heart sounds.   No murmur heard.  No edema. Pulmonary/Chest: Effort normal and breath sounds normal. No respiratory distress. She has no wheezes. She has no rales.  Breast: deferred   Abdominal: Soft. She exhibits no distension. There is no tenderness.  Lymphadenopathy: She has no cervical adenopathy.  Skin: Skin is warm and dry. She is not diaphoretic.  Psychiatric: She has a normal mood and affect. Her behavior is normal.     Lab Results  Component Value Date   WBC 11.0 (H) 08/07/2023   HGB 13.5 08/07/2023   HCT 41.4 08/07/2023   PLT 319.0 08/07/2023   GLUCOSE 80  08/07/2023   CHOL 189 08/07/2023   TRIG 66.0 08/07/2023   HDL 68.90 08/07/2023   LDLCALC 106 (H) 08/07/2023   ALT 16 08/07/2023   AST 21 08/07/2023   NA 138 08/07/2023   K 3.9 08/07/2023   CL 102 08/07/2023   CREATININE 0.86 08/07/2023   BUN 13 08/07/2023   CO2 27 08/07/2023   TSH 2.92 08/07/2023         Assessment & Plan:   Physical exam: Screening blood work  ordered Exercise  crossfit Weight  overweight Substance abuse  none   Reviewed recommended immunizations.   Health Maintenance  Topic Date Due   Cervical Cancer Screening (HPV/Pap Cotest)  02/21/2023   COVID-19 Vaccine (1 - 2024-25 season) Never done   INFLUENZA VACCINE  01/31/2025 (Originally 06/03/2024)   DTaP/Tdap/Td (8 - Td or Tdap) 04/03/2025   Hepatitis B Vaccines 19-59 Average Risk  Completed   HIV Screening  Completed   Pneumococcal Vaccine  Aged Out   Meningococcal B Vaccine  Aged Out   HPV VACCINES  Discontinued   Hepatitis C Screening  Discontinued          See Problem List for Assessment and Plan of chronic medical problems.

## 2024-07-06 NOTE — Patient Instructions (Addendum)
 Blood work was ordered.       Medications changes include :   None     Return in about 1 year (around 07/07/2025) for Physical Exam.    Health Maintenance, Female Adopting a healthy lifestyle and getting preventive care are important in promoting health and wellness. Ask your health care provider about: The right schedule for you to have regular tests and exams. Things you can do on your own to prevent diseases and keep yourself healthy. What should I know about diet, weight, and exercise? Eat a healthy diet  Eat a diet that includes plenty of vegetables, fruits, low-fat dairy products, and lean protein. Do not eat a lot of foods that are high in solid fats, added sugars, or sodium. Maintain a healthy weight Body mass index (BMI) is used to identify weight problems. It estimates body fat based on height and weight. Your health care provider can help determine your BMI and help you achieve or maintain a healthy weight. Get regular exercise Get regular exercise. This is one of the most important things you can do for your health. Most adults should: Exercise for at least 150 minutes each week. The exercise should increase your heart rate and make you sweat (moderate-intensity exercise). Do strengthening exercises at least twice a week. This is in addition to the moderate-intensity exercise. Spend less time sitting. Even light physical activity can be beneficial. Watch cholesterol and blood lipids Have your blood tested for lipids and cholesterol at 33 years of age, then have this test every 5 years. Have your cholesterol levels checked more often if: Your lipid or cholesterol levels are high. You are older than 33 years of age. You are at high risk for heart disease. What should I know about cancer screening? Depending on your health history and family history, you may need to have cancer screening at various ages. This may include screening for: Breast cancer. Cervical  cancer. Colorectal cancer. Skin cancer. Lung cancer. What should I know about heart disease, diabetes, and high blood pressure? Blood pressure and heart disease High blood pressure causes heart disease and increases the risk of stroke. This is more likely to develop in people who have high blood pressure readings or are overweight. Have your blood pressure checked: Every 3-5 years if you are 84-20 years of age. Every year if you are 62 years old or older. Diabetes Have regular diabetes screenings. This checks your fasting blood sugar level. Have the screening done: Once every three years after age 52 if you are at a normal weight and have a low risk for diabetes. More often and at a younger age if you are overweight or have a high risk for diabetes. What should I know about preventing infection? Hepatitis B If you have a higher risk for hepatitis B, you should be screened for this virus. Talk with your health care provider to find out if you are at risk for hepatitis B infection. Hepatitis C Testing is recommended for: Everyone born from [birth year] through 1965. Anyone with known risk factors for hepatitis C. Sexually transmitted infections (STIs) Get screened for STIs, including gonorrhea and chlamydia, if: You are sexually active and are younger than 33 years of age. You are older than 33 years of age and your health care provider tells you that you are at risk for this type of infection. Your sexual activity has changed since you were last screened, and you are at increased risk for chlamydia or  gonorrhea. Ask your health care provider if you are at risk. Ask your health care provider about whether you are at high risk for HIV. Your health care provider may recommend a prescription medicine to help prevent HIV infection. If you choose to take medicine to prevent HIV, you should first get tested for HIV. You should then be tested every 3 months for as long as you are taking the  medicine. Pregnancy If you are about to stop having your period (premenopausal) and you may become pregnant, seek counseling before you get pregnant. Take 400 to 800 micrograms (mcg) of folic acid every day if you become pregnant. Ask for birth control (contraception) if you want to prevent pregnancy. Osteoporosis and menopause Osteoporosis is a disease in which the bones lose minerals and strength with aging. This can result in bone fractures. If you are 88 years old or older, or if you are at risk for osteoporosis and fractures, ask your health care provider if you should: Be screened for bone loss. Take a calcium or vitamin D  supplement to lower your risk of fractures. Be given hormone replacement therapy (HRT) to treat symptoms of menopause. Follow these instructions at home: Alcohol use Do not drink alcohol if: Your health care provider tells you not to drink. You are pregnant, may be pregnant, or are planning to become pregnant. If you drink alcohol: Limit how much you have to: 0-1 drink a day. Know how much alcohol is in your drink. In the [place] one drink equals one 12 oz bottle of beer (355 mL), one 5 oz glass of wine (148 mL), or one 1 oz glass of hard liquor (44 mL). Lifestyle Do not use any products that contain nicotine or tobacco. These products include cigarettes, chewing tobacco, and vaping devices, such as e-cigarettes. If you need help quitting, ask your health care provider. Do not use street drugs. Do not share needles. Ask your health care provider for help if you need support or information about quitting drugs. General instructions Schedule regular health, dental, and eye exams. Stay current with your vaccines. Tell your health care provider if: You often feel depressed. You have ever been abused or do not feel safe at home. Summary Adopting a healthy lifestyle and getting preventive care are important in promoting health and wellness. Follow your health care  provider's instructions about healthy diet, exercising, and getting tested or screened for diseases. Follow your health care provider's instructions on monitoring your cholesterol and blood pressure. This information is not intended to replace advice given to you by your health care provider. Make sure you discuss any questions you have with your health care provider. Document Revised: 03/11/2021 Document Reviewed: 03/11/2021 Elsevier Patient Education  2024 [employer].

## 2024-07-07 ENCOUNTER — Ambulatory Visit: Payer: Self-pay | Admitting: Internal Medicine

## 2024-07-07 ENCOUNTER — Ambulatory Visit: Payer: PRIVATE HEALTH INSURANCE | Admitting: Internal Medicine

## 2024-07-07 VITALS — BP 114/72 | HR 78 | Temp 98.6°F | Ht 66.0 in | Wt 172.0 lb

## 2024-07-07 DIAGNOSIS — Z0000 Encounter for general adult medical examination without abnormal findings: Secondary | ICD-10-CM | POA: Diagnosis not present

## 2024-07-07 DIAGNOSIS — E039 Hypothyroidism, unspecified: Secondary | ICD-10-CM

## 2024-07-07 LAB — COMPREHENSIVE METABOLIC PANEL WITH GFR
ALT: 8 U/L (ref 0–35)
AST: 16 U/L (ref 0–37)
Albumin: 4.4 g/dL (ref 3.5–5.2)
Alkaline Phosphatase: 68 U/L (ref 39–117)
BUN: 13 mg/dL (ref 6–23)
CO2: 28 meq/L (ref 19–32)
Calcium: 9 mg/dL (ref 8.4–10.5)
Chloride: 102 meq/L (ref 96–112)
Creatinine, Ser: 0.8 mg/dL (ref 0.40–1.20)
GFR: 96.78 mL/min (ref >60.00)
Glucose, Bld: 82 mg/dL (ref 70–99)
Potassium: 3.9 meq/L (ref 3.5–5.1)
Sodium: 139 meq/L (ref 135–145)
Total Bilirubin: 0.6 mg/dL (ref 0.2–1.2)
Total Protein: 7.4 g/dL (ref 6.0–8.3)

## 2024-07-07 LAB — CBC WITH DIFFERENTIAL/PLATELET
Basophils Absolute: 0.1 K/uL (ref 0.0–0.1)
Basophils Relative: 0.9 % (ref 0.0–3.0)
Eosinophils Absolute: 0.2 K/uL (ref 0.0–0.7)
Eosinophils Relative: 2.5 % (ref 0.0–5.0)
HCT: 40 % (ref 36.0–46.0)
Hemoglobin: 13.3 g/dL (ref 12.0–15.0)
Lymphocytes Relative: 25.9 % (ref 12.0–46.0)
Lymphs Abs: 2 K/uL (ref 0.7–4.0)
MCHC: 33.2 g/dL (ref 30.0–36.0)
MCV: 87.4 fl (ref 78.0–100.0)
Monocytes Absolute: 0.7 K/uL (ref 0.1–1.0)
Monocytes Relative: 9 % (ref 3.0–12.0)
Neutro Abs: 4.7 K/uL (ref 1.4–7.7)
Neutrophils Relative %: 61.7 % (ref 43.0–77.0)
Platelets: 289 K/uL (ref 150.0–400.0)
RBC: 4.57 Mil/uL (ref 3.87–5.11)
RDW: 13 % (ref 11.5–15.5)
WBC: 7.7 K/uL (ref 4.0–10.5)

## 2024-07-07 LAB — LIPID PANEL
Cholesterol: 163 mg/dL (ref 0–200)
HDL: 49.2 mg/dL (ref >39.00)
LDL Cholesterol: 97 mg/dL (ref 0–99)
NonHDL: 113.3
Total CHOL/HDL Ratio: 3
Triglycerides: 80 mg/dL (ref 0.0–149.0)
VLDL: 16 mg/dL (ref 0.0–40.0)

## 2024-07-07 LAB — TSH: TSH: 6.14 u[IU]/mL — ABNORMAL HIGH (ref 0.35–5.50)

## 2024-07-07 NOTE — Assessment & Plan Note (Signed)
 Chronic  Clinically euthyroid Check tsh, cbc, cmp, lipid Continue levothyroxine  75 mcg daily x 5 days a week, 150 mcg twice a week

## 2024-07-08 ENCOUNTER — Ambulatory Visit: Payer: Self-pay | Admitting: Internal Medicine

## 2024-07-08 ENCOUNTER — Other Ambulatory Visit: Payer: PRIVATE HEALTH INSURANCE

## 2024-07-08 DIAGNOSIS — E039 Hypothyroidism, unspecified: Secondary | ICD-10-CM | POA: Diagnosis not present

## 2024-07-08 DIAGNOSIS — Z0000 Encounter for general adult medical examination without abnormal findings: Secondary | ICD-10-CM

## 2024-07-08 LAB — TSH: TSH: 3.15 u[IU]/mL (ref 0.35–5.50)

## 2024-07-08 LAB — T4, FREE: Free T4: 1.16 ng/dL (ref 0.60–1.60)

## 2024-08-12 ENCOUNTER — Encounter: Payer: PRIVATE HEALTH INSURANCE | Admitting: Internal Medicine

## 2024-09-05 ENCOUNTER — Other Ambulatory Visit: Payer: Self-pay | Admitting: Internal Medicine

## 2024-09-05 ENCOUNTER — Other Ambulatory Visit (HOSPITAL_COMMUNITY): Payer: Self-pay

## 2024-09-05 DIAGNOSIS — E063 Autoimmune thyroiditis: Secondary | ICD-10-CM

## 2024-09-05 MED ORDER — LEVOTHYROXINE SODIUM 75 MCG PO TABS
75.0000 ug | ORAL_TABLET | Freq: Every day | ORAL | 3 refills | Status: DC
Start: 2024-09-05 — End: 2024-09-09
  Filled 2024-09-05: qty 30, 22d supply, fill #0
  Filled 2024-09-09: qty 114, 57d supply, fill #0

## 2024-09-07 ENCOUNTER — Encounter (HOSPITAL_COMMUNITY): Payer: Self-pay

## 2024-09-07 ENCOUNTER — Other Ambulatory Visit: Payer: Self-pay

## 2024-09-07 ENCOUNTER — Other Ambulatory Visit (HOSPITAL_COMMUNITY): Payer: Self-pay

## 2024-09-09 ENCOUNTER — Encounter: Payer: Self-pay | Admitting: Internal Medicine

## 2024-09-09 ENCOUNTER — Other Ambulatory Visit (HOSPITAL_COMMUNITY): Payer: Self-pay

## 2024-09-09 ENCOUNTER — Other Ambulatory Visit: Payer: Self-pay

## 2024-09-09 DIAGNOSIS — E063 Autoimmune thyroiditis: Secondary | ICD-10-CM

## 2024-09-09 MED ORDER — LEVOTHYROXINE SODIUM 75 MCG PO TABS: 75.0000 ug | ORAL_TABLET | Freq: Every day | ORAL | 3 refills | Status: AC

## 2024-12-15 ENCOUNTER — Ambulatory Visit: Payer: PRIVATE HEALTH INSURANCE | Admitting: Family Medicine
